# Patient Record
Sex: Female | Born: 1945 | Race: White | Hispanic: No | Marital: Single | Smoking: Former smoker
Health system: Southern US, Community
[De-identification: ages and names within clinical notes are randomized; demographics above are authoritative.]

## PROBLEM LIST (undated history)

## (undated) DIAGNOSIS — K219 Gastro-esophageal reflux disease without esophagitis: Secondary | ICD-10-CM

## (undated) DIAGNOSIS — I10 Essential (primary) hypertension: Secondary | ICD-10-CM

## (undated) DIAGNOSIS — G5600 Carpal tunnel syndrome, unspecified upper limb: Secondary | ICD-10-CM

## (undated) DIAGNOSIS — M549 Dorsalgia, unspecified: Secondary | ICD-10-CM

## (undated) DIAGNOSIS — E7800 Pure hypercholesterolemia, unspecified: Secondary | ICD-10-CM

## (undated) HISTORY — PX: CATARACT EXTRACTION BILATERAL W/ ANTERIOR VITRECTOMY: SHX1304

## (undated) HISTORY — DX: Dorsalgia, unspecified: M54.9

## (undated) HISTORY — DX: Carpal tunnel syndrome, unspecified upper limb: G56.00

---

## 2002-02-14 ENCOUNTER — Ambulatory Visit (HOSPITAL_COMMUNITY): Admission: RE | Admit: 2002-02-14 | Discharge: 2002-02-14 | Payer: Self-pay | Admitting: Family Medicine

## 2002-02-14 ENCOUNTER — Encounter: Payer: Self-pay | Admitting: Family Medicine

## 2003-07-06 ENCOUNTER — Ambulatory Visit (HOSPITAL_COMMUNITY): Admission: RE | Admit: 2003-07-06 | Discharge: 2003-07-06 | Payer: Self-pay | Admitting: Family Medicine

## 2008-05-27 ENCOUNTER — Emergency Department (HOSPITAL_COMMUNITY): Admission: EM | Admit: 2008-05-27 | Discharge: 2008-05-27 | Payer: Self-pay | Admitting: Emergency Medicine

## 2008-06-14 ENCOUNTER — Ambulatory Visit (HOSPITAL_COMMUNITY): Admission: RE | Admit: 2008-06-14 | Discharge: 2008-06-14 | Payer: Self-pay | Admitting: Family Medicine

## 2008-08-20 ENCOUNTER — Encounter: Payer: Self-pay | Admitting: Gastroenterology

## 2008-09-04 ENCOUNTER — Encounter: Payer: Self-pay | Admitting: Gastroenterology

## 2008-09-04 ENCOUNTER — Ambulatory Visit (HOSPITAL_COMMUNITY): Admission: RE | Admit: 2008-09-04 | Discharge: 2008-09-04 | Payer: Self-pay | Admitting: Gastroenterology

## 2008-09-04 ENCOUNTER — Ambulatory Visit: Payer: Self-pay | Admitting: Gastroenterology

## 2009-12-25 ENCOUNTER — Ambulatory Visit (HOSPITAL_COMMUNITY): Admission: RE | Admit: 2009-12-25 | Discharge: 2009-12-25 | Payer: Self-pay | Admitting: Family Medicine

## 2010-06-04 LAB — BASIC METABOLIC PANEL
CO2: 24 mEq/L (ref 19–32)
Chloride: 107 mEq/L (ref 96–112)
GFR calc Af Amer: >60 mL/min (ref >60)
Glucose, Bld: 137 mg/dL — ABNORMAL HIGH (ref 70–99)
Sodium: 137 mEq/L (ref 135–145)

## 2010-06-04 LAB — CBC
HCT: 37.3 % (ref 36.0–46.0)
Hemoglobin: 13.1 g/dL (ref 12.0–15.0)
MCHC: 35.2 g/dL (ref 30.0–36.0)
MCV: 92.5 fL (ref 78.0–100.0)
RBC: 4.03 MIL/uL (ref 3.87–5.11)
RDW: 13 % (ref 11.5–15.5)

## 2010-06-04 LAB — DIFFERENTIAL
Basophils Absolute: 0 10*3/uL (ref 0.0–0.1)
Basophils Relative: 1 % (ref 0–1)
Eosinophils Absolute: 0.3 10*3/uL (ref 0.0–0.7)
Eosinophils Relative: 5 % (ref 0–5)
Monocytes Absolute: 0.6 10*3/uL (ref 0.1–1.0)
Monocytes Relative: 10 % (ref 3–12)

## 2010-06-04 LAB — POCT CARDIAC MARKERS
CKMB, poc: 1.2 ng/mL (ref 1.0–8.0)
CKMB, poc: 1.4 ng/mL (ref 1.0–8.0)
Myoglobin, poc: 63.9 ng/mL (ref 12–200)
Troponin i, poc: <0.05 ng/mL (ref 0.00–0.09)

## 2010-06-20 ENCOUNTER — Emergency Department (HOSPITAL_COMMUNITY)
Admission: EM | Admit: 2010-06-20 | Discharge: 2010-06-21 | Disposition: A | Discharge status: Home / Self Care | Payer: BC Managed Care – PPO | Attending: Emergency Medicine | Admitting: Emergency Medicine

## 2010-06-20 ENCOUNTER — Emergency Department (HOSPITAL_COMMUNITY): Payer: BC Managed Care – PPO

## 2010-06-20 DIAGNOSIS — R071 Chest pain on breathing: Secondary | ICD-10-CM | POA: Insufficient documentation

## 2010-06-21 LAB — BASIC METABOLIC PANEL
BUN: 14 mg/dL (ref 6–23)
CO2: 25 mEq/L (ref 19–32)
Calcium: 8.9 mg/dL (ref 8.4–10.5)
Chloride: 99 mEq/L (ref 96–112)
Creatinine, Ser: 0.69 mg/dL (ref 0.4–1.2)
GFR calc Af Amer: >60 mL/min (ref >60)
GFR calc non Af Amer: >60 mL/min (ref >60)
Glucose, Bld: 97 mg/dL (ref 70–99)
Potassium: 3.8 mEq/L (ref 3.5–5.1)
Sodium: 131 mEq/L — ABNORMAL LOW (ref 135–145)

## 2010-06-21 LAB — DIFFERENTIAL
Basophils Absolute: 0 10*3/uL (ref 0.0–0.1)
Basophils Relative: 0 % (ref 0–1)
Eosinophils Absolute: 0.1 10*3/uL (ref 0.0–0.7)
Eosinophils Relative: 2 % (ref 0–5)
Lymphocytes Relative: 33 % (ref 12–46)
Lymphs Abs: 2.2 10*3/uL (ref 0.7–4.0)
Monocytes Absolute: 0.7 10*3/uL (ref 0.1–1.0)
Monocytes Relative: 11 % (ref 3–12)
Neutro Abs: 3.7 10*3/uL (ref 1.7–7.7)
Neutrophils Relative %: 55 % (ref 43–77)

## 2010-06-21 LAB — CBC
HCT: 37.6 % (ref 36.0–46.0)
Hemoglobin: 12.5 g/dL (ref 12.0–15.0)
MCH: 31 pg (ref 26.0–34.0)
MCHC: 33.2 g/dL (ref 30.0–36.0)
MCV: 93.3 fL (ref 78.0–100.0)
Platelets: 281 10*3/uL (ref 150–400)
RBC: 4.03 MIL/uL (ref 3.87–5.11)
RDW: 12.8 % (ref 11.5–15.5)
WBC: 6.7 10*3/uL (ref 4.0–10.5)

## 2010-06-21 LAB — POCT CARDIAC MARKERS
CKMB, poc: 2.7 ng/mL (ref 1.0–8.0)
Myoglobin, poc: 71 ng/mL (ref 12–200)

## 2010-07-08 NOTE — Op Note (Signed)
Morgan Hill, Morgan Hill                ACCOUNT NO.:  [id number]   MEDICAL RECORD NO.:  [id number]          PATIENT TYPE:  AMB   LOCATION:  DAY                           FACILITY:  [facility]   PHYSICIAN:  Kassie Mends, M.D.      DATE OF BIRTH:  12-05-1945   DATE OF PROCEDURE:  09/04/2008  DATE OF DISCHARGE:  09/04/2008                               OPERATIVE REPORT   REFERRING PHYSICIAN:  Donna Bernard, MD   PROCEDURE:  Colonoscopy with cold forceps polypectomy.   INDICATION FOR EXAM:  Morgan Hill is a 65 year old female who presented 2  weeks ago with bloody diarrhea.  Her abdominal pain and diarrhea and  rectal bleeding have resolved.   FINDINGS:  1. A 4-mm sessile rectal polyp removed via cold forceps.  Otherwise,      no masses, inflammatory changes, or AVMs.  2. Rare sigmoid colon diverticula.  3. Normal terminal ileum, approximately 10 cm visualized.  4. Small internal hemorrhoids, otherwise normal retroflexed view of      the rectum.   RECOMMENDATIONS:  1. Will call Morgan Hill with the results of her biopsies.  If she has      a simple adenoma or hyperplastic polyps, then screening colonoscopy      in 10 years.  2. No aspirin, NSAIDs, or anticoagulation for 7 days.  She should      follow a high-fiber diet.  She was given a handout on high-fiber      diet, polyps, diverticulosis, and hemorrhoids.   MEDICATIONS:  1. Demerol 75 mg IV.  2. Versed 5 mg IV.   PROCEDURE TECHNIQUE:  Physical exam was performed.  Informed consent was  obtained from the patient after explaining the benefits, risks, and  alternatives to the procedure.  The patient was connected to the  monitored and placed in left lateral position.  Continuous oxygen was  provided by nasal cannula and IV medicine administered through an  indwelling cannula.  After administration of sedation and rectal exam,  the patient's rectum was intubated, and the scope was advanced under  direct visualization to the distal  terminal ileum.  Scope was removed  slowly by  carefully examining the color, texture, anatomy, and integrity of the  mucosa on the way out.  The patient was recovered in endoscopy and  discharged home in satisfactory condition.   PATH:  BENIGN POLYPOID TISSUE      Kassie Mends, M.D.  Electronically Signed     SM/MEDQ  D:  09/04/2008  T:  09/05/2008  Job:  [id number]   cc:   Donna Bernard, M.D.  Fax: [phone]

## 2012-04-01 ENCOUNTER — Other Ambulatory Visit: Payer: Self-pay | Admitting: Family Medicine

## 2012-04-01 DIAGNOSIS — Z139 Encounter for screening, unspecified: Secondary | ICD-10-CM

## 2012-04-05 ENCOUNTER — Ambulatory Visit (HOSPITAL_COMMUNITY): Payer: BC Managed Care – PPO

## 2012-04-11 ENCOUNTER — Ambulatory Visit (HOSPITAL_COMMUNITY)
Admission: RE | Admit: 2012-04-11 | Discharge: 2012-04-11 | Disposition: A | Discharge status: Home / Self Care | Payer: BC Managed Care – PPO | Source: Ambulatory Visit | Attending: Family Medicine | Admitting: Family Medicine

## 2012-04-11 DIAGNOSIS — Z139 Encounter for screening, unspecified: Secondary | ICD-10-CM

## 2012-04-11 DIAGNOSIS — Z1231 Encounter for screening mammogram for malignant neoplasm of breast: Secondary | ICD-10-CM | POA: Insufficient documentation

## 2012-04-11 LAB — HM MAMMOGRAPHY: HM MAMMO: NORMAL

## 2013-01-10 ENCOUNTER — Encounter: Payer: Self-pay | Admitting: Family Medicine

## 2013-01-10 ENCOUNTER — Ambulatory Visit (INDEPENDENT_AMBULATORY_CARE_PROVIDER_SITE_OTHER): Payer: BC Managed Care – PPO | Admitting: Family Medicine

## 2013-01-10 VITALS — BP 148/70 | Temp 98.3°F | Ht 63.0 in | Wt 185.0 lb

## 2013-01-10 DIAGNOSIS — J309 Allergic rhinitis, unspecified: Secondary | ICD-10-CM

## 2013-01-10 DIAGNOSIS — J019 Acute sinusitis, unspecified: Secondary | ICD-10-CM

## 2013-01-10 MED ORDER — SULFAMETHOXAZOLE-TMP DS 800-160 MG PO TABS: 1.0000 | ORAL_TABLET | Freq: Two times a day (BID) | ORAL | Status: DC

## 2013-01-10 MED ORDER — FLUTICASONE PROPIONATE 50 MCG/ACT NA SUSP: 2.0000 | Freq: Every day | NASAL | Status: DC

## 2013-01-10 NOTE — Progress Notes (Signed)
  Subjective:    Patient ID: Morgan Hill, female    DOB: 10-Aug-1945, 67 y.o.   MRN: [id number]  Sore Throat  This is a recurrent problem. The current episode started more than 1 month ago.   This fall with allergy issues. Oct 7 had flu shot Later in Oct got worse, had sinus like illness No wheezing no fever no SOB PMH- benign Urgent care Amoxil in Oct  PMH benign [facility]   Review of Systems See above, hoarse as the days goes on    Objective:   Physical Exam  Nursing note and vitals reviewed. Constitutional: She appears well-developed.  HENT:  Head: Normocephalic.  Nose: Nose normal.  Mouth/Throat: Oropharynx is clear and moist. No oropharyngeal exudate.  Neck: Neck supple.  Cardiovascular: Normal rate and normal heart sounds.   No murmur heard. Pulmonary/Chest: Effort normal and breath sounds normal. She has no wheezes.  Lymphadenopathy:    She has no cervical adenopathy.  Skin: Skin is warm and dry.          Assessment & Plan:  Allergic rhinitis-flonase / loratadine for 4 weeks Sinusitis - Bactrim DS bid 10 days Call if ongoing Warning signs were discussed followup if any problems

## 2013-01-10 NOTE — Patient Instructions (Signed)
Allergic rhinitis- use generic Claritin ( called Loratadine ) 10 mg tablet take daily  Flonase 2 sprays each nare daily  Use allergy med for next 4 weeks, you should clear up  Add antibiotic to it

## 2013-05-02 ENCOUNTER — Other Ambulatory Visit: Payer: Self-pay

## 2013-05-02 ENCOUNTER — Encounter (HOSPITAL_COMMUNITY): Payer: Self-pay | Admitting: Emergency Medicine

## 2013-05-02 ENCOUNTER — Observation Stay (HOSPITAL_COMMUNITY)
Admission: EM | Admit: 2013-05-02 | Discharge: 2013-05-03 | Disposition: A | Discharge status: Home / Self Care | Payer: BC Managed Care – PPO | Attending: Internal Medicine | Admitting: Internal Medicine

## 2013-05-02 ENCOUNTER — Emergency Department (HOSPITAL_COMMUNITY): Payer: BC Managed Care – PPO

## 2013-05-02 DIAGNOSIS — R7989 Other specified abnormal findings of blood chemistry: Secondary | ICD-10-CM | POA: Diagnosis present

## 2013-05-02 DIAGNOSIS — Z87891 Personal history of nicotine dependence: Secondary | ICD-10-CM | POA: Insufficient documentation

## 2013-05-02 DIAGNOSIS — Z7982 Long term (current) use of aspirin: Secondary | ICD-10-CM | POA: Insufficient documentation

## 2013-05-02 DIAGNOSIS — E039 Hypothyroidism, unspecified: Secondary | ICD-10-CM | POA: Insufficient documentation

## 2013-05-02 DIAGNOSIS — K219 Gastro-esophageal reflux disease without esophagitis: Secondary | ICD-10-CM | POA: Diagnosis present

## 2013-05-02 DIAGNOSIS — R079 Chest pain, unspecified: Principal | ICD-10-CM | POA: Diagnosis present

## 2013-05-02 DIAGNOSIS — E669 Obesity, unspecified: Secondary | ICD-10-CM | POA: Diagnosis present

## 2013-05-02 HISTORY — DX: Gastro-esophageal reflux disease without esophagitis: K21.9

## 2013-05-02 LAB — CBC WITH DIFFERENTIAL/PLATELET
Basophils Absolute: 0 10*3/uL (ref 0.0–0.1)
Basophils Relative: 1 % (ref 0–1)
Eosinophils Absolute: 0.1 10*3/uL (ref 0.0–0.7)
Eosinophils Relative: 1 % (ref 0–5)
HEMATOCRIT: 42.3 % (ref 36.0–46.0)
HEMOGLOBIN: 14.3 g/dL (ref 12.0–15.0)
LYMPHS ABS: 1.5 10*3/uL (ref 0.7–4.0)
LYMPHS PCT: 23 % (ref 12–46)
MCH: 31.2 pg (ref 26.0–34.0)
MCHC: 33.8 g/dL (ref 30.0–36.0)
MCV: 92.2 fL (ref 78.0–100.0)
MONO ABS: 0.4 10*3/uL (ref 0.1–1.0)
MONOS PCT: 7 % (ref 3–12)
NEUTROS ABS: 4.5 10*3/uL (ref 1.7–7.7)
NEUTROS PCT: 69 % (ref 43–77)
Platelets: 302 10*3/uL (ref 150–400)
RBC: 4.59 MIL/uL (ref 3.87–5.11)
RDW: 12.7 % (ref 11.5–15.5)
WBC: 6.5 10*3/uL (ref 4.0–10.5)

## 2013-05-02 LAB — BASIC METABOLIC PANEL
BUN: 12 mg/dL (ref 6–23)
CHLORIDE: 100 meq/L (ref 96–112)
CO2: 25 meq/L (ref 19–32)
CREATININE: 0.72 mg/dL (ref 0.50–1.10)
Calcium: 10 mg/dL (ref 8.4–10.5)
GFR calc non Af Amer: 88 mL/min — ABNORMAL LOW (ref >90)
GLUCOSE: 117 mg/dL — AB (ref 70–99)
POTASSIUM: 3.9 meq/L (ref 3.7–5.3)
Sodium: 140 mEq/L (ref 137–147)

## 2013-05-02 LAB — TROPONIN I
Troponin I: <0.3 ng/mL (ref <0.30)
Troponin I: <0.3 ng/mL (ref <0.30)

## 2013-05-02 MED ORDER — HEPARIN SODIUM (PORCINE) 5000 UNIT/ML IJ SOLN
5000.0000 [IU] | Freq: Three times a day (TID) | INTRAMUSCULAR | Status: DC
Start: 2013-05-02 — End: 2013-05-03
  Administered 2013-05-02 – 2013-05-03 (×4): 5000 [IU] via SUBCUTANEOUS
  Filled 2013-05-02 (×4): qty 1

## 2013-05-02 MED ORDER — NITROGLYCERIN 0.4 MG SL SUBL: 0.4000 mg | SUBLINGUAL_TABLET | SUBLINGUAL | Status: DC | PRN

## 2013-05-02 MED ORDER — GI COCKTAIL ~~LOC~~
30.0000 mL | Freq: Once | ORAL | Status: AC
  Administered 2013-05-02: 30 mL via ORAL
  Filled 2013-05-02: qty 30

## 2013-05-02 MED ORDER — ONDANSETRON HCL 4 MG/2ML IJ SOLN: 4.0000 mg | Freq: Four times a day (QID) | INTRAMUSCULAR | Status: DC | PRN

## 2013-05-02 MED ORDER — ASPIRIN 81 MG PO CHEW
324.0000 mg | CHEWABLE_TABLET | Freq: Once | ORAL | Status: AC
  Administered 2013-05-02: 324 mg via ORAL
  Filled 2013-05-02: qty 4

## 2013-05-02 MED ORDER — NITROGLYCERIN 0.4 MG SL SUBL
0.4000 mg | SUBLINGUAL_TABLET | Freq: Once | SUBLINGUAL | Status: DC
  Filled 2013-05-02: qty 1

## 2013-05-02 MED ORDER — ONDANSETRON HCL 4 MG PO TABS: 4.0000 mg | ORAL_TABLET | Freq: Four times a day (QID) | ORAL | Status: DC | PRN

## 2013-05-02 NOTE — ED Provider Notes (Signed)
CSN: [id number]     Arrival date & time 05/02/13  1336 History  This chart was scribed for Morgan Creasehristopher J. Pollina, MD by Quintella ReichertMatthew Underwood, ED scribe.  This patient was seen in room [facility] and the patient's care was started at 2:12 PM.   Chief Complaint  Patient presents with  . Chest Pain    The history is provided by the patient. No language interpreter was used.    HPI Comments: Morgan Hill is a 68 y.o. female who presents to the Emergency Department complaining of sudden-onset chest pain that began this morning.  Pt states she initially felt dizzy and lightheaded and then became nauseated.  She then developed a burning and pressure sensation in her mid-center chest.  She checked her BP and it was 170/92.  She drank some vinegar ("home remedy" for high BP) and began to feel slightly better and when she checked her BP again it had improved slightly.  Her CP has improved slightly but she can still feel "a little bit heat" in that area.  Pt states she has had similar symptoms previously which were diagnosed as GERD.  She does note that she had heartburn yesterday.  She denies prior h/o HTN and does not take any medications regularly.   History reviewed. No pertinent past medical history.  History reviewed. No pertinent past surgical history.  No family history on file.   History  Substance Use Topics  . Smoking status: Former Games developermoker  . Smokeless tobacco: Former NeurosurgeonUser    Quit date: 01/11/1996  . Alcohol Use: Not on file    OB History   Grav Para Term Preterm Abortions TAB SAB Ect Mult Living                   Review of Systems  Cardiovascular: Positive for chest pain.  Gastrointestinal: Positive for nausea.       Heartburn yesterday  Neurological: Positive for dizziness and light-headedness.  All other systems reviewed and are negative.      Allergies  Review of patient's allergies indicates no known allergies.  Home Medications   No current outpatient  prescriptions on file. BP 148/67  Pulse 82  Temp(Src) 98.4 F (36.9 C) (Oral)  Resp 18  Ht 5\' 3"  (1.6 m)  Wt 182 lb (82.555 kg)  BMI 32.25 kg/m2  SpO2 99%  Physical Exam  Nursing note and vitals reviewed. Constitutional: She is oriented to person, place, and time. She appears well-developed and well-nourished. No distress.  HENT:  Head: Normocephalic and atraumatic.  Right Ear: Hearing normal.  Left Ear: Hearing normal.  Nose: Nose normal.  Mouth/Throat: Oropharynx is clear and moist and mucous membranes are normal.  Eyes: Conjunctivae and EOM are normal. Pupils are equal, round, and reactive to light.  Neck: Normal range of motion. Neck supple.  Cardiovascular: Regular rhythm, S1 normal and S2 normal.  Exam reveals no gallop and no friction rub.   No murmur heard. Pulmonary/Chest: Effort normal and breath sounds normal. No respiratory distress. She exhibits no tenderness.  Abdominal: Soft. Normal appearance and bowel sounds are normal. There is no hepatosplenomegaly. There is no tenderness. There is no rebound, no guarding, no tenderness at McBurney's point and negative Murphy's sign. No hernia.  Musculoskeletal: Normal range of motion.  Neurological: She is alert and oriented to person, place, and time. She has normal strength. No cranial nerve deficit or sensory deficit. Coordination normal. GCS eye subscore is 4. GCS verbal subscore is 5. GCS  motor subscore is 6.  Skin: Skin is warm, dry and intact. No rash noted. No cyanosis.  Psychiatric: She has a normal mood and affect. Her speech is normal and behavior is normal. Thought content normal.    ED Course  Procedures (including critical care time)  DIAGNOSTIC STUDIES: Oxygen Saturation is 99% on room air, normal by my interpretation.    COORDINATION OF CARE: 2:16 PM-Discussed treatment plan which includes review of pt's records, EKG, CXR, and labs with pt at bedside and pt agreed to plan.     Labs Review Labs Reviewed   BASIC METABOLIC PANEL - Abnormal; Notable for the following:    Glucose, Bld 117 (*)    GFR calc non Af Amer 88 (*)    All other components within normal limits  TROPONIN I  CBC WITH DIFFERENTIAL    Imaging Review Dg Chest Portable 1 View  05/02/2013   CLINICAL DATA Chest pain and hypertension  EXAM PORTABLE CHEST - 1 VIEW  COMPARISON DG CHEST 2 VIEW dated 06/20/2010; DG CHEST 2 VIEW dated 06/14/2008; DG CHEST 2 VIEW dated 05/27/2008  FINDINGS The heart size and mediastinal contours are within normal limits. Both lungs are clear. The visualized skeletal structures are unremarkable.  IMPRESSION No active disease.  SIGNATURE  Electronically Signed   By: Esperanza Heir M.D.   On: 05/02/2013 14:29     EKG Interpretation None      Date: 05/02/2013  Rate: 79  Rhythm: normal sinus rhythm  QRS Axis: normal  Intervals: normal  ST/T Wave abnormalities: normal  Conduction Disutrbances:none  Narrative Interpretation:   Old EKG Reviewed: unchanged    MDM   Final diagnoses:  Chest pain    Patient presents to the ER for evaluation of chest pain. Patient reports that she started having dizziness, felt weak, nauseated and then noticed a burning sensation in her chest. She was at work when this occurred. She thinks that the pain got slightly worse when she went back to work and exerted herself. Her blood pressure was checked at work and it was somewhat elevated, 170/92 she does not have a previous history of hypertension. Blood pressure was somewhat improved here in the ER. After a single nitroglycerin, now normotensive. Patient's pain is essentially gone, although she now tells me she is having intermittent episodes of a burning sensation that come and go quickly. Patient's EKG does not show any ischemic changes. Troponin is negative.   further discussion with the patient reveals that both of her parents have had heart attacks. She has a significant family history including parental parents and  siblings have had coronary artery disease. She has never had a cardiac workup and therefore I have recommended hospitalization for further evaluation.    I personally performed the services described in this documentation, which was scribed in my presence. The recorded information has been reviewed and is accurate.'    Morgan Crease, MD 05/02/13 [phone]

## 2013-05-02 NOTE — ED Notes (Signed)
MD at bedside. 

## 2013-05-02 NOTE — H&P (Signed)
[facility] History and Physical  HAMDA KLUTTS ZOX:[id number] DOB: 1945-12-22 DOA: 05/02/2013  Referring physician: ER. PCP: Lilyan Punt, MD   Chief Complaint: Chest pain.  HPI: Morgan Hill is a 68 y.o. female  This is a very pleasant 68 year old [place] lady who presents with chest pain. The chest pain is in the lower chest, started at 7:30 AM this morning while she was at work. The initial chest pain lasted approximately 5 minutes or so, improved and then further chest pain came back. She said that overall she has had chest pain 3-4 times today. The characteristic of the chest pain are that it is a burning pain with some heaviness. It is not associated with sweating or nausea. He does not seem to radiate into her arms or upper neck. There is no history of coronary artery disease in the past. She did have reflux yesterday which she is familiar with, this is different to the symptoms she is had today. She denies any dyspnea or palpitations. At work, she was found to be hypertensive and also in the emergency room, now she is normotensive. Both her parents have had coronary disease but this was at later stage in her life. She does not have a history of hypertension, diabetes her cholesterol is modestly elevated according to her. She is a nonsmoker.   Review of Systems:  Constitutional:  No weight loss, night sweats, Fevers, chills, fatigue.  HEENT:  No headaches, Difficulty swallowing,Tooth/dental problems,Sore throat,  No sneezing, itching, ear ache, nasal congestion, post nasal drip,  Cardio-vascular:  No Orthopnea, PND, swelling in lower extremities, anasarca, dizziness, palpitations  GI:  No abdominal pain, nausea, vomiting, diarrhea, change in bowel habits, loss of appetite  Resp:  No shortness of breath with exertion or at rest. No excess mucus, no productive cough, No non-productive cough, No coughing up of blood.No change in color of mucus.No wheezing.No chest wall  deformity  Skin:  no rash or lesions.  GU:  no dysuria, change in color of urine, no urgency or frequency. No flank pain.  Musculoskeletal:  No joint pain or swelling. No decreased range of motion. No back pain.  Psych:  No change in mood or affect. No depression or anxiety. No memory loss.     Social History:  reports that she has quit smoking. She quit smokeless tobacco use about 17 years ago. Her alcohol and drug histories are not on file. she has lived in this country for [place] years. 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  No Known Allergies  No family history on file.   Prior to Admission medications   Not on File   Physical Exam: Filed Vitals:   05/02/13 1530  BP:   Pulse: 81  Temp:   Resp: 14    BP 117/59  Pulse 81  Temp(Src) 98.4 F (36.9 C) (Oral)  Resp 14  Ht 5\' 3"  (1.6 m)  Wt 82.555 kg (182 lb)  BMI 32.25 kg/m2  SpO2 99%  General:  Appears calm and comfortable Eyes: PERRL, normal lids, irises & conjunctiva ENT: grossly normal hearing, lips & tongue Neck: no LAD, masses or thyromegaly Cardiovascular: RRR, no m/r/g. No LE edema. Telemetry: SR, no arrhythmias  Respiratory: CTA bilaterally, no w/r/r. Normal respiratory effort. Abdomen: soft, ntnd Skin: no rash or induration seen on limited exam Musculoskeletal: grossly normal tone BUE/BLE. No chest wall tenderness. Psychiatric: grossly normal mood and affect, speech fluent and appropriate Neurologic: grossly non-focal.  Labs on Admission:  Basic Metabolic Panel:  Recent Labs Lab 05/02/13 1421  NA 140  K 3.9  CL 100  CO2 25  GLUCOSE 117*  BUN 12  CREATININE 0.72  CALCIUM 10.0       CBC:  Recent Labs Lab 05/02/13 1421  WBC 6.5  NEUTROABS 4.5  HGB 14.3  HCT 42.3  MCV 92.2  PLT 302   Cardiac Enzymes:  Recent Labs Lab 05/02/13 1421  TROPONINI <0.30    BNP (last 3 results) No results found for this basename: PROBNP,  in the last 8760 hours CBG: No results  found for this basename: GLUCAP,  in the last 168 hours  Radiological Exams on Admission: Dg Chest Portable 1 View  05/02/2013   CLINICAL DATA Chest pain and hypertension  EXAM PORTABLE CHEST - 1 VIEW  COMPARISON DG CHEST 2 VIEW dated 06/20/2010; DG CHEST 2 VIEW dated 06/14/2008; DG CHEST 2 VIEW dated 05/27/2008  FINDINGS The heart size and mediastinal contours are within normal limits. Both lungs are clear. The visualized skeletal structures are unremarkable.  IMPRESSION No active disease.  SIGNATURE  Electronically Signed   By: Esperanza Heiraymond  Rubner M.D.   On: 05/02/2013 14:29    EKG: Independently reviewed. Normal sinus rhythm without any acute ST-T wave changes.  Assessment/Plan   1. Chest pain. The characteristics of the pain are somewhat worrisome for angina/cardiac pain. 2. Obesity.  Plan: 1. Admit to telemetry floor. 2. Serial cardiac enzymes and ECG in the morning. 3. Cardiology consultation. I think she requires a stress test at a minimum.  Other recommendations will depend on patient's hospital progress.   Code Status: Full code.   Family Communication: Discussed plan with patient at the bedside.   Disposition Plan: Home when medically stable.  Time spent: 30 minutes.  Wilson SingerGOSRANI,Jezebel Pollet C [facility]

## 2013-05-02 NOTE — ED Notes (Signed)
Pt states that she was at work this am when she had a sudden onset of being light headed, burning sensation and pressure to mid center area. Pain has been non radiating, states that personal checked her blood pressure at work and it was elevated 179/92. Denies any episodes of n/v, diaphorsis, reports that she did have heartburn yesterday.

## 2013-05-03 ENCOUNTER — Other Ambulatory Visit: Payer: Self-pay | Admitting: *Deleted

## 2013-05-03 DIAGNOSIS — R7989 Other specified abnormal findings of blood chemistry: Secondary | ICD-10-CM | POA: Diagnosis present

## 2013-05-03 DIAGNOSIS — R946 Abnormal results of thyroid function studies: Secondary | ICD-10-CM

## 2013-05-03 DIAGNOSIS — R079 Chest pain, unspecified: Secondary | ICD-10-CM

## 2013-05-03 DIAGNOSIS — K219 Gastro-esophageal reflux disease without esophagitis: Secondary | ICD-10-CM

## 2013-05-03 LAB — LIPID PANEL
CHOLESTEROL: 189 mg/dL (ref 0–200)
HDL: 78 mg/dL (ref >39)
LDL Cholesterol: 96 mg/dL (ref 0–99)
Total CHOL/HDL Ratio: 2.4 RATIO
Triglycerides: 76 mg/dL (ref <150)
VLDL: 15 mg/dL (ref 0–40)

## 2013-05-03 LAB — TSH: TSH: 6.491 u[IU]/mL — ABNORMAL HIGH (ref 0.350–4.500)

## 2013-05-03 LAB — COMPREHENSIVE METABOLIC PANEL
ALK PHOS: 57 U/L (ref 39–117)
ALT: 16 U/L (ref 0–35)
AST: 17 U/L (ref 0–37)
Albumin: 3.7 g/dL (ref 3.5–5.2)
BILIRUBIN TOTAL: 0.7 mg/dL (ref 0.3–1.2)
BUN: 14 mg/dL (ref 6–23)
CHLORIDE: 105 meq/L (ref 96–112)
CO2: 26 meq/L (ref 19–32)
Calcium: 9 mg/dL (ref 8.4–10.5)
Creatinine, Ser: 0.78 mg/dL (ref 0.50–1.10)
GFR calc Af Amer: >90 mL/min (ref >90)
GFR, EST NON AFRICAN AMERICAN: 85 mL/min — AB (ref >90)
Glucose, Bld: 102 mg/dL — ABNORMAL HIGH (ref 70–99)
Potassium: 4.1 mEq/L (ref 3.7–5.3)
SODIUM: 144 meq/L (ref 137–147)
Total Protein: 7.2 g/dL (ref 6.0–8.3)

## 2013-05-03 LAB — CBC
HCT: 39.3 % (ref 36.0–46.0)
Hemoglobin: 13.1 g/dL (ref 12.0–15.0)
MCH: 31.2 pg (ref 26.0–34.0)
MCHC: 33.3 g/dL (ref 30.0–36.0)
MCV: 93.6 fL (ref 78.0–100.0)
Platelets: 271 10*3/uL (ref 150–400)
RBC: 4.2 MIL/uL (ref 3.87–5.11)
RDW: 12.9 % (ref 11.5–15.5)
WBC: 5.2 10*3/uL (ref 4.0–10.5)

## 2013-05-03 LAB — TROPONIN I

## 2013-05-03 MED ORDER — ASPIRIN 81 MG PO TBEC: 81.0000 mg | DELAYED_RELEASE_TABLET | Freq: Every day | ORAL | Status: DC

## 2013-05-03 MED ORDER — ASPIRIN EC 81 MG PO TBEC
81.0000 mg | DELAYED_RELEASE_TABLET | Freq: Every day | ORAL | Status: DC
  Administered 2013-05-03: 81 mg via ORAL
  Filled 2013-05-03: qty 1

## 2013-05-03 MED ORDER — NITROGLYCERIN 0.4 MG SL SUBL: 0.4000 mg | SUBLINGUAL_TABLET | SUBLINGUAL | Status: DC | PRN

## 2013-05-03 NOTE — Consult Note (Signed)
Reason for Consult:chest pain Referring Physician: PTH  AALAIYAH YASSIN is an 68 y.o. female.  HPI: This is a 68 year old female patient with no prior cardiac history who was admitted with chest pain yesterday. Cardiac enzymes are negative an EKG shows T wave inversion in aVR and V1, unchanged from EKG in 2012.  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 Her BP was elevated & they gave her vinegar which made her reflux worse. The pain eased after 5-10 minutes. Monday she had trouble with GE reflux off/on all day. More of a burning and reflux into her esophagus. It was different from yesterdays chest pain. 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 She's also been under a lot of stress.  She has a 30 pk yr history of smoking but quit in 1997. Both parents had MI's in their 19's. She has never had HTN, DM, or hyperlipidemia. Denies exertional chest pressure. She continues to feel a little something in her chest today but can't describe it.  Past Medical History  Diagnosis Date  . GERD (gastroesophageal reflux disease)     History reviewed. No pertinent past surgical history.  History reviewed. No pertinent family history.  Social History:  reports that she has quit smoking. She quit smokeless tobacco use about 17 years ago. Her alcohol and drug histories are not on file.  Allergies: No Known Allergies  Medications:  Scheduled Meds: . heparin  5,000 Units Subcutaneous 3 times per day  . nitroGLYCERIN  0.4 mg Sublingual Once   Continuous Infusions:  PRN Meds:.nitroGLYCERIN, ondansetron (ZOFRAN) IV, ondansetron   Results for orders placed during the hospital encounter of 05/02/13 (from the past 48 hour(s))  TROPONIN I     Status: None   Collection Time    05/02/13  2:21 PM      Result Value Ref  Range   Troponin I <0.30  <0.30 ng/mL   Comment:            Due to the release kinetics of cTnI,     a negative result within the first hours     of the onset of symptoms does not rule out     myocardial infarction with certainty.     If myocardial infarction is still suspected,     repeat the test at appropriate intervals.  CBC WITH DIFFERENTIAL     Status: None   Collection Time    05/02/13  2:21 PM      Result Value Ref Range   WBC 6.5  4.0 - 10.5 K/uL   RBC 4.59  3.87 - 5.11 MIL/uL   Hemoglobin 14.3  12.0 - 15.0 g/dL   HCT 42.3  36.0 - 46.0 %   MCV 92.2  78.0 - 100.0 fL   MCH 31.2  26.0 - 34.0 pg   MCHC 33.8  30.0 - 36.0 g/dL   RDW 12.7  11.5 - 15.5 %   Platelets 302  150 - 400 K/uL   Neutrophils Relative % 69  43 - 77 %   Neutro Abs 4.5  1.7 - 7.7 K/uL   Lymphocytes Relative 23  12 - 46 %   Lymphs Abs 1.5  0.7 - 4.0 K/uL   Monocytes Relative 7  3 - 12 %  Monocytes Absolute 0.4  0.1 - 1.0 K/uL   Eosinophils Relative 1  0 - 5 %   Eosinophils Absolute 0.1  0.0 - 0.7 K/uL   Basophils Relative 1  0 - 1 %   Basophils Absolute 0.0  0.0 - 0.1 K/uL  BASIC METABOLIC PANEL     Status: Abnormal   Collection Time    05/02/13  2:21 PM      Result Value Ref Range   Sodium 140  137 - 147 mEq/L   Potassium 3.9  3.7 - 5.3 mEq/L   Chloride 100  96 - 112 mEq/L   CO2 25  19 - 32 mEq/L   Glucose, Bld 117 (*) 70 - 99 mg/dL   BUN 12  6 - 23 mg/dL   Creatinine, Ser 0.72  0.50 - 1.10 mg/dL   Calcium 10.0  8.4 - 10.5 mg/dL   GFR calc non Af Amer 88 (*) >90 mL/min   GFR calc Af Amer >90  >90 mL/min   Comment: (NOTE)     The eGFR has been calculated using the CKD EPI equation.     This calculation has not been validated in all clinical situations.     eGFR's persistently <90 mL/min signify possible Chronic Kidney     Disease.  TSH     Status: Abnormal   Collection Time    05/02/13  4:04 PM      Result Value Ref Range   TSH 6.491 (*) 0.350 - 4.500 uIU/mL   Comment: Performed at  [facility]  TROPONIN I     Status: None   Collection Time    05/02/13  4:04 PM      Result Value Ref Range   Troponin I <0.30  <0.30 ng/mL   Comment:            Due to the release kinetics of cTnI,     a negative result within the first hours     of the onset of symptoms does not rule out     myocardial infarction with certainty.     If myocardial infarction is still suspected,     repeat the test at appropriate intervals.  TROPONIN I     Status: None   Collection Time    05/02/13 10:00 PM      Result Value Ref Range   Troponin I <0.30  <0.30 ng/mL   Comment:            Due to the release kinetics of cTnI,     a negative result within the first hours     of the onset of symptoms does not rule out     myocardial infarction with certainty.     If myocardial infarction is still suspected,     repeat the test at appropriate intervals.  TROPONIN I     Status: None   Collection Time    05/03/13  3:58 AM      Result Value Ref Range   Troponin I <0.30  <0.30 ng/mL   Comment:            Due to the release kinetics of cTnI,     a negative result within the first hours     of the onset of symptoms does not rule out     myocardial infarction with certainty.     If myocardial infarction is still suspected,     repeat the test at appropriate intervals.  COMPREHENSIVE METABOLIC  PANEL     Status: Abnormal   Collection Time    05/03/13  3:58 AM      Result Value Ref Range   Sodium 144  137 - 147 mEq/L   Potassium 4.1  3.7 - 5.3 mEq/L   Chloride 105  96 - 112 mEq/L   CO2 26  19 - 32 mEq/L   Glucose, Bld 102 (*) 70 - 99 mg/dL   BUN 14  6 - 23 mg/dL   Creatinine, Ser 0.78  0.50 - 1.10 mg/dL   Calcium 9.0  8.4 - 10.5 mg/dL   Total Protein 7.2  6.0 - 8.3 g/dL   Albumin 3.7  3.5 - 5.2 g/dL   AST 17  0 - 37 U/L   ALT 16  0 - 35 U/L   Alkaline Phosphatase 57  39 - 117 U/L   Total Bilirubin 0.7  0.3 - 1.2 mg/dL   GFR calc non Af Amer 85 (*) >90 mL/min   GFR calc Af Amer >90   >90 mL/min   Comment: (NOTE)     The eGFR has been calculated using the CKD EPI equation.     This calculation has not been validated in all clinical situations.     eGFR's persistently <90 mL/min signify possible Chronic Kidney     Disease.  CBC     Status: None   Collection Time    05/03/13  3:58 AM      Result Value Ref Range   WBC 5.2  4.0 - 10.5 K/uL   RBC 4.20  3.87 - 5.11 MIL/uL   Hemoglobin 13.1  12.0 - 15.0 g/dL   HCT 39.3  36.0 - 46.0 %   MCV 93.6  78.0 - 100.0 fL   MCH 31.2  26.0 - 34.0 pg   MCHC 33.3  30.0 - 36.0 g/dL   RDW 12.9  11.5 - 15.5 %   Platelets 271  150 - 400 K/uL  LIPID PANEL     Status: None   Collection Time    05/03/13  3:58 AM      Result Value Ref Range   Cholesterol 189  0 - 200 mg/dL   Triglycerides 76  <150 mg/dL   HDL 78  >39 mg/dL   Total CHOL/HDL Ratio 2.4     VLDL 15  0 - 40 mg/dL   LDL Cholesterol 96  0 - 99 mg/dL   Comment:            Total Cholesterol/HDL:CHD Risk     Coronary Heart Disease Risk Table                         Men   Women      1/2 Average Risk   3.4   3.3      Average Risk       5.0   4.4      2 X Average Risk   9.6   7.1      3 X Average Risk  23.4   11.0                Use the calculated Patient Ratio     above and the CHD Risk Table     to determine the patient's CHD Risk.                ATP III CLASSIFICATION (LDL):      <100  mg/dL   Optimal      100-129  mg/dL   Near or Above                        Optimal      130-159  mg/dL   Borderline      160-189  mg/dL   High      >190     mg/dL   Very High    Dg Chest Portable 1 View  05/02/2013   CLINICAL DATA Chest pain and hypertension  EXAM PORTABLE CHEST - 1 VIEW  COMPARISON DG CHEST 2 VIEW dated 06/20/2010; DG CHEST 2 VIEW dated 06/14/2008; DG CHEST 2 VIEW dated 05/27/2008  FINDINGS The heart size and mediastinal contours are within normal limits. Both lungs are clear. The visualized skeletal structures are unremarkable.  IMPRESSION No active disease.   SIGNATURE  Electronically Signed   By: Skipper Cliche M.D.   On: 05/02/2013 14:29    ROS See HPI Eyes: Negative Ears:Negative for hearing loss, tinnitus Cardiovascular: Negative for  palpitations,irregular heartbeat, dyspnea, dyspnea on exertion, near-syncope, orthopnea, paroxysmal nocturnal dyspnea and syncope,edema, claudication, cyanosis,.  Respiratory:   Negative for cough, hemoptysis, shortness of breath, sleep disturbances due to breathing, sputum production and wheezing.   Endocrine: Negative for cold intolerance and heat intolerance.  Hematologic/Lymphatic: Negative for adenopathy and bleeding problem. Does not bruise/bleed easily.  Musculoskeletal: Negative.   Gastrointestinal: Negative for nausea, vomiting,  abdominal pain, diarrhea, constipation.   Genitourinary: Negative for bladder incontinence, dysuria, flank pain, frequency, hematuria, hesitancy, nocturia and urgency.  Neurological: Negative.  Allergic/Immunologic: Negative for environmental allergies.  Blood pressure 107/60, pulse 62, temperature 97.6 F (36.4 C), temperature source Oral, resp. rate 20, height 5' 5" (1.651 m), weight 182 lb 1.6 oz (82.6 kg), SpO2 99.00%. Physical Exam PHYSICAL EXAM: Well-nournished, in no acute distress. Neck: No JVD, HJR, Bruit, or thyroid enlargement Lungs: No tachypnea, clear without wheezing, rales, or rhonchi Cardiovascular: RRR, PMI not displaced, heart sounds normal, no murmurs, gallops, bruit, thrill, or heave. Abdomen: BS normal. Soft without organomegaly, masses, lesions or tenderness. Extremities: without cyanosis, clubbing or edema. Good distal pulses bilateral SKin: Warm, no lesions or rashes  Musculoskeletal: No deformities Neuro: no focal signs    Assessment/Plan:  Chest pain: MI ruled out with negative cardiac enzymes. Probably musculoskeletal chest pain, but with multiple cardiac risk factors, should have stress myoview. ? Discharge and do as outpatient?  GERD has  been bothering her recently with increased TUMS use.  Family hx CAD  Former Smoker  New hypothyroid: TSH 6.91  Ermalinda Barrios 05/03/2013, 8:08 AM   Patient seen and discussed with PA Lenze. 68 yo female with no known cardiac history presents with chest pain. Episode started yesterday around 730 AM, 3/10 mid chest/epigastric discomfort with mild palpitations and dizziness that occurred at rest. No SOB. Lasted a few minutes and resolved, has had intermittent discomfort left chest last just a few seconds since then. Risk factors are tobacco and age. She has a family history in older individuals. Fairly mixed symptoms, more atypical. EKG and cardiac enzymes without evidence of ACS, she is more than 24 hrs out from her symptoms. Recommend discharge with outpatient stress echo with cardiology follow up in 1-2 weeks. No further inpatient cardiac intervention or testing at this time, will sign off. ASA and SL NG at discharge.   EKG NSR with no specific EKG changes, troponins have been negative, CXR no acute process. Elevated  TSH 6.49. LDL 96 HDL 78 TG 76 TC 189.     Carlyle Dolly MD

## 2013-05-03 NOTE — Discharge Instructions (Signed)
Chest Pain (Nonspecific) °It is often hard to give a specific diagnosis for the cause of chest pain. There is always a chance that your pain could be related to something serious, such as a heart attack or a blood clot in the lungs. You need to follow up with your caregiver for further evaluation. °CAUSES  °· Heartburn. °· Pneumonia or bronchitis. °· Anxiety or stress. °· Inflammation around your heart (pericarditis) or lung (pleuritis or pleurisy). °· A blood clot in the lung. °· A collapsed lung (pneumothorax). It can develop suddenly on its own (spontaneous pneumothorax) or from injury (trauma) to the chest. °· Shingles infection (herpes zoster virus). °The chest wall is composed of bones, muscles, and cartilage. Any of these can be the source of the pain. °· The bones can be bruised by injury. °· The muscles or cartilage can be strained by coughing or overwork. °· The cartilage can be affected by inflammation and become sore (costochondritis). °DIAGNOSIS  °Lab tests or other studies, such as X-rays, electrocardiography, stress testing, or cardiac imaging, may be needed to find the cause of your pain.  °TREATMENT  °· Treatment depends on what may be causing your chest pain. Treatment may include: °· Acid blockers for heartburn. °· Anti-inflammatory medicine. °· Pain medicine for inflammatory conditions. °· Antibiotics if an infection is present. °· You may be advised to change lifestyle habits. This includes stopping smoking and avoiding alcohol, caffeine, and chocolate. °· You may be advised to keep your head raised (elevated) when sleeping. This reduces the chance of acid going backward from your stomach into your esophagus. °· Most of the time, nonspecific chest pain will improve within 2 to 3 days with rest and mild pain medicine. °HOME CARE INSTRUCTIONS  °· If antibiotics were prescribed, take your antibiotics as directed. Finish them even if you start to feel better. °· For the next few days, avoid physical  activities that bring on chest pain. Continue physical activities as directed. °· Do not smoke. °· Avoid drinking alcohol. °· Only take over-the-counter or prescription medicine for pain, discomfort, or fever as directed by your caregiver. °· Follow your caregiver's suggestions for further testing if your chest pain does not go away. °· Keep any follow-up appointments you made. If you do not go to an appointment, you could develop lasting (chronic) problems with pain. If there is any problem keeping an appointment, you must call to reschedule. °SEEK MEDICAL CARE IF:  °· You think you are having problems from the medicine you are taking. Read your medicine instructions carefully. °· Your chest pain does not go away, even after treatment. °· You develop a rash with blisters on your chest. °SEEK IMMEDIATE MEDICAL CARE IF:  °· You have increased chest pain or pain that spreads to your arm, neck, jaw, back, or abdomen. °· You develop shortness of breath, an increasing cough, or you are coughing up blood. °· You have severe back or abdominal pain, feel nauseous, or vomit. °· You develop severe weakness, fainting, or chills. °· You have a fever. °THIS IS AN EMERGENCY. Do not wait to see if the pain will go away. Get medical help at once. Call your local emergency services (911 in [place] Do not drive yourself to the hospital. °MAKE SURE YOU:  °· Understand these instructions. °· Will watch your condition. °· Will get help right away if you are not doing well or get worse. °Document Released: 11/19/2004 Document Revised: 05/04/2011 Document Reviewed: 09/15/2007 °ExitCare® Patient Information ©2014 [employer]   [employer]. ° °

## 2013-05-03 NOTE — Discharge Summary (Signed)
Physician Discharge Summary  Morgan Hill ZOX:[id number] DOB: 05/08/1945 DOA: 05/02/2013  PCP: Lilyan PuntLUKING,SCOTT, MD  Admit date: 05/02/2013 Discharge date: 05/03/2013  Time spent: 40 minutes  Recommendations for Outpatient Follow-up:  1. Follow up with cardiology in 1-2 weeks for outpatient stress echo 2. Repeat thyroid studies when followed up by primary care doctor  Discharge Diagnoses:  Active Problems:   Chest pain   Obesity   GERD (gastroesophageal reflux disease)   Elevated TSH   Discharge Condition: improved  Diet recommendation: low salt  Filed Weights   05/02/13 1347 05/02/13 1642 05/03/13 0417  Weight: 82.555 kg (182 lb) 82.555 kg (182 lb) 82.6 kg (182 lb 1.6 oz)    History of present illness:  This is a very pleasant 68 year old [place] lady who presents with chest pain. The chest pain is in the lower chest, started at 7:30 AM this morning while she was at work. The initial chest pain lasted approximately 5 minutes or so, improved and then further chest pain came back. She said that overall she has had chest pain 3-4 times today. The characteristic of the chest pain are that it is a burning pain with some heaviness. It is not associated with sweating or nausea. He does not seem to radiate into her arms or upper neck. There is no history of coronary artery disease in the past. She did have reflux yesterday which she is familiar with, this is different to the symptoms she is had today. She denies any dyspnea or palpitations. At work, she was found to be hypertensive and also in the emergency room, now she is normotensive.  Both her parents have had coronary disease but this was at later stage in her life. She does not have a history of hypertension, diabetes her cholesterol is modestly elevated according to her. She is a nonsmoker.   Hospital Course:  This patient was admitted to the hospital with chest pain. She was monitored on telemetry and cardiac enzymes are negative x3.  EKG was unremarkable. She was seen by cardiology it was felt that outpatient stress echocardiogram would be reasonable. The patient has not had any further symptoms here in-hospital. She is ambulating the hallway without recurrence of chest pain. It is possible that her pain may be related to acid reflux. The patient is cleared for discharge home. Incidentally, her TSH was noted to be mildly elevated at 6.4. She will likely need repeat thyroid studies including T3-T4 as an outpatient next 1-2 weeks when she follows up with her primary care doctor.  Procedures:  none  Consultations:  cardiology  Discharge Exam: Filed Vitals:   05/03/13 1255  BP: 117/55  Pulse: 78  Temp: 98 F (36.7 C)  Resp: 20    General: NAD Cardiovascular: S1, S2 RRR Respiratory: cta b  Discharge Instructions  Discharge Orders   Future Appointments Provider Department Dept Phone   05/24/2013 1:30 PM Jodelle GrossKathryn M Lawrence, NP [facility] [phone]   Future Orders Complete By Expires   Call MD for:  difficulty breathing, headache or visual disturbances  As directed    Call MD for:  severe uncontrolled pain  As directed    Diet - low sodium heart healthy  As directed    Increase activity slowly  As directed        Medication List         aspirin 81 MG EC tablet  Take 1 tablet (81 mg total) by mouth daily.     nitroGLYCERIN 0.4  MG SL tablet  Commonly known as:  NITROSTAT  Place 1 tablet (0.4 mg total) under the tongue every 5 (five) minutes as needed for chest pain.       No Known Allergies     Follow-up Information   Follow up with LUKING,SCOTT, MD. Schedule an appointment as soon as possible for a visit in 2 weeks.   Specialty:  Family Medicine   Contact information:   [address] [place] [address] [phone]        The results of significant diagnostics from this hospitalization (including imaging, microbiology, ancillary and laboratory) are listed below  for reference.    Significant Diagnostic Studies: Dg Chest Portable 1 View  05/02/2013   CLINICAL DATA Chest pain and hypertension  EXAM PORTABLE CHEST - 1 VIEW  COMPARISON DG CHEST 2 VIEW dated 06/20/2010; DG CHEST 2 VIEW dated 06/14/2008; DG CHEST 2 VIEW dated 05/27/2008  FINDINGS The heart size and mediastinal contours are within normal limits. Both lungs are clear. The visualized skeletal structures are unremarkable.  IMPRESSION No active disease.  SIGNATURE  Electronically Signed   By: Esperanza Heir M.D.   On: 05/02/2013 14:29    Microbiology: No results found for this or any previous visit (from the past 240 hour(s)).   Labs: Basic Metabolic Panel:  Recent Labs Lab 05/02/13 1421 05/03/13 0358  NA 140 144  K 3.9 4.1  CL 100 105  CO2 25 26  GLUCOSE 117* 102*  BUN 12 14  CREATININE 0.72 0.78  CALCIUM 10.0 9.0   Liver Function Tests:  Recent Labs Lab 05/03/13 0358  AST 17  ALT 16  ALKPHOS 57  BILITOT 0.7  PROT 7.2  ALBUMIN 3.7   No results found for this basename: LIPASE, AMYLASE,  in the last 168 hours No results found for this basename: AMMONIA,  in the last 168 hours CBC:  Recent Labs Lab 05/02/13 1421 05/03/13 0358  WBC 6.5 5.2  NEUTROABS 4.5  --   HGB 14.3 13.1  HCT 42.3 39.3  MCV 92.2 93.6  PLT 302 271   Cardiac Enzymes:  Recent Labs Lab 05/02/13 1421 05/02/13 1604 05/02/13 2200 05/03/13 0358  TROPONINI <0.30 <0.30 <0.30 <0.30   BNP: BNP (last 3 results) No results found for this basename: PROBNP,  in the last 8760 hours CBG: No results found for this basename: GLUCAP,  in the last 168 hours     Signed:  Alize Borrayo  [facility] 05/03/2013, 3:53 PM

## 2013-05-03 NOTE — Discharge Planning (Signed)
Pt stated she was ready to go home and she had no pain.  Pts IV and tele were removed.  Pt was given DC papers and educated on future s/sx of infection or Cardiac related.  Pt also instructed what might cause need to call Dr. Or return to the hospital.  Pt will be walked to car by myself and family when ready.

## 2013-05-06 ENCOUNTER — Encounter: Payer: Self-pay | Admitting: *Deleted

## 2013-05-08 ENCOUNTER — Encounter: Payer: Self-pay | Admitting: Family Medicine

## 2013-05-08 ENCOUNTER — Ambulatory Visit (INDEPENDENT_AMBULATORY_CARE_PROVIDER_SITE_OTHER): Payer: BC Managed Care – PPO | Admitting: Family Medicine

## 2013-05-08 VITALS — BP 138/80 | Temp 98.5°F | Ht 63.0 in | Wt 180.0 lb

## 2013-05-08 DIAGNOSIS — R079 Chest pain, unspecified: Secondary | ICD-10-CM

## 2013-05-08 DIAGNOSIS — R946 Abnormal results of thyroid function studies: Secondary | ICD-10-CM

## 2013-05-08 DIAGNOSIS — R7989 Other specified abnormal findings of blood chemistry: Secondary | ICD-10-CM

## 2013-05-08 NOTE — Progress Notes (Signed)
   Subjective:    Patient ID: Morgan Hill, female    DOB: 06/26/1945, 68 y.o.   MRN: [id number]  HPI Patient was [facility] on 05/02/13 for chest discomfort. EKG and labwork was done and everything came back normal. Patient is scheduled to have a stress test done on 05/12/13. She states that she still has lightheadedness and weakness. No other concerns at this time.  I reviewed over the results of this patient's x-rays lab work and hospitalization.  They states that she was working and then that morning she felt a burning pulling sensation in her chest lasted for a period of time along with slight heaviness she went to the ER they ruled out MI they released her they have scheduled her this Friday for a stress echo. She does state occasionally gets belching. She denies any sweats chills nausea vomiting denies any fevers. She does stay with activity she gets short of breath but she attributes that to being out of shape  PMH Family history review patient does not smoke  Review of Systems  Constitutional: Negative for activity change, appetite change and fatigue.  HENT: Negative for congestion, ear discharge and rhinorrhea.   Eyes: Negative for discharge.  Respiratory: Positive for chest tightness. Negative for cough and wheezing.   Cardiovascular: Positive for chest pain.  Gastrointestinal: Negative for vomiting and abdominal pain.  Genitourinary: Negative for frequency and difficulty urinating.  Musculoskeletal: Negative for neck pain.  Neurological: Negative for weakness and headaches.  Psychiatric/Behavioral: Negative for behavioral problems.   See above.    Objective:   Physical Exam  Constitutional: She is oriented to person, place, and time. She appears well-developed and well-nourished.  HENT:  Head: Normocephalic.  Neck: No thyromegaly present.  Cardiovascular: Normal rate, regular rhythm, normal heart sounds and intact distal pulses.   No murmur  heard. Pulmonary/Chest: Effort normal and breath sounds normal. No respiratory distress. She has no wheezes.  Abdominal: Soft. Bowel sounds are normal. She exhibits no distension and no mass. There is no tenderness.  Musculoskeletal: Normal range of motion. She exhibits no edema and no tenderness.  Lymphadenopathy:    She has no cervical adenopathy.  Neurological: She is alert and oriented to person, place, and time. She exhibits normal muscle tone.  Skin: Skin is warm and dry.  Psychiatric: She has a normal mood and affect. Her behavior is normal.          Assessment & Plan:  #1 chest pain-cardiac does need to be ruled out but does not seem to be highly likely c. I believe that patient's best approach would be getting a stress test done to rule out cardiac. Patient was started on omeprazole to cover for reflux she is to followup here in several weeks if she is still having ongoing trouble we will refer her to gastroenterology for endoscopy. Patient does not need any scans at this point I do not feel she is having DVTs or pulmonary emboli

## 2013-05-12 ENCOUNTER — Encounter (HOSPITAL_COMMUNITY): Payer: Self-pay

## 2013-05-12 ENCOUNTER — Ambulatory Visit (HOSPITAL_COMMUNITY)
Admission: RE | Admit: 2013-05-12 | Discharge: 2013-05-12 | Disposition: A | Discharge status: Home / Self Care | Payer: BC Managed Care – PPO | Source: Ambulatory Visit | Attending: Family Medicine | Admitting: Family Medicine

## 2013-05-12 DIAGNOSIS — R072 Precordial pain: Secondary | ICD-10-CM

## 2013-05-12 DIAGNOSIS — E669 Obesity, unspecified: Secondary | ICD-10-CM | POA: Insufficient documentation

## 2013-05-12 DIAGNOSIS — R079 Chest pain, unspecified: Secondary | ICD-10-CM

## 2013-05-12 DIAGNOSIS — Z6832 Body mass index (BMI) 32.0-32.9, adult: Secondary | ICD-10-CM | POA: Insufficient documentation

## 2013-05-12 NOTE — Progress Notes (Signed)
*  PRELIMINARY RESULTS* Echocardiogram Echocardiogram Stress Test has been performed.  Morgan Hill 05/12/2013, 11:10 AM

## 2013-05-12 NOTE — Progress Notes (Signed)
Stress Lab Nurses Notes - [facility]  [facility] Dronerika C Desena 05/12/2013 Reason for doing test: Chest Pain Type of test: Stress Echo Nurse performing test: Parke PoissonPhyllis Billingsly, RN Nuclear Medicine Tech: Not Applicable Echo Tech: Veda Canningindy Rigg MD performing test: Koneswaran/K.Lyman BishopLawrence NP Family MD: Dr. Lilyan PuntScott Luking Test explained and consent signed: yes IV started: No IV started Symptoms: SOB Treatment/Intervention: None Reason test stopped: fatigue After recovery IV was: NA Patient to return to Nuc. Med at : NA Patient discharged: Home Patient's Condition upon discharge was: stable Comments: During test peak BP 204/72 & HR 150.  Recovery BP 172/54 & HR 90.  Symptoms resolved in recovery. Erskine SpeedBillingsley, Odus Clasby T

## 2013-05-24 ENCOUNTER — Encounter: Payer: Self-pay | Admitting: *Deleted

## 2013-05-24 ENCOUNTER — Encounter: Payer: Self-pay | Admitting: Adult Health

## 2013-05-24 ENCOUNTER — Ambulatory Visit (INDEPENDENT_AMBULATORY_CARE_PROVIDER_SITE_OTHER): Payer: BC Managed Care – PPO | Admitting: Adult Health

## 2013-05-24 VITALS — BP 147/66 | HR 81 | Ht 63.0 in | Wt 179.0 lb

## 2013-05-24 DIAGNOSIS — R079 Chest pain, unspecified: Secondary | ICD-10-CM

## 2013-05-24 DIAGNOSIS — K219 Gastro-esophageal reflux disease without esophagitis: Secondary | ICD-10-CM

## 2013-05-24 NOTE — Assessment & Plan Note (Signed)
Followed by Dr. Gerda DissLuking for this with PPI. He will follow this and make referrals to GI if he feels this to be clinically helpful to her.

## 2013-05-24 NOTE — Assessment & Plan Note (Signed)
Normal stress echo. No signs of ischemia or dysfunction. She is without further cardiac complaint. She will be seen on a PRN basis.

## 2013-05-24 NOTE — Progress Notes (Signed)
Name: Morgan Hill    DOB: 09/26/1945  Age: 68 y.o.  MR#: [id number]       PCP:  Sallee Lange, MD      Insurance: Payor: [place] / Plan: [employer]BS [place] PPO / Product Type: *No Product type* /   CC:    Chief Complaint  Patient presents with  . Chest Pain    VS Filed Vitals:   05/24/13 1348  BP: 147/66  Pulse: 81  Height: 5' 3"  (1.6 m)  Weight: 179 lb (81.194 kg)    Weights Current Weight  05/24/13 179 lb (81.194 kg)  05/08/13 180 lb (81.647 kg)  05/03/13 182 lb 1.6 oz (82.6 kg)    Blood Pressure  BP Readings from Last 3 Encounters:  05/24/13 147/66  05/08/13 138/80  05/03/13 117/55     Admit date:  (Not on file) Last encounter with RMR:  Visit date not found   Allergy Review of patient's allergies indicates no known allergies.  Current Outpatient Prescriptions  Medication Sig Dispense Refill  . nitroGLYCERIN (NITROSTAT) 0.4 MG SL tablet Place 1 tablet (0.4 mg total) under the tongue every 5 (five) minutes as needed for chest pain.  30 tablet  12  . omeprazole (PRILOSEC) 20 MG capsule Take 20 mg by mouth daily.       No current facility-administered medications for this visit.    Discontinued Meds:    Medications Discontinued During This Encounter  Medication Reason  . aspirin EC 81 MG EC tablet Error    Patient Active Problem List   Diagnosis Date Noted  . GERD (gastroesophageal reflux disease) 05/03/2013  . Elevated TSH 05/03/2013  . Chest pain 05/02/2013  . Obesity 05/02/2013    LABS    Component Value Date/Time   NA 144 05/03/2013 0358   NA 140 05/02/2013 1421   NA 131* 06/20/2010 2341   K 4.1 05/03/2013 0358   K 3.9 05/02/2013 1421   K 3.8 06/20/2010 2341   CL 105 05/03/2013 0358   CL 100 05/02/2013 1421   CL 99 06/20/2010 2341   CO2 26 05/03/2013 0358   CO2 25 05/02/2013 1421   CO2 25 06/20/2010 2341   GLUCOSE 102* 05/03/2013 0358   GLUCOSE 117* 05/02/2013 1421   GLUCOSE 97 06/20/2010 2341   BUN 14 05/03/2013 0358   BUN 12 05/02/2013 1421   BUN 14 06/20/2010 2341   CREATININE 0.78 05/03/2013 0358   CREATININE 0.72 05/02/2013 1421   CREATININE 0.69 06/20/2010 2341   CALCIUM 9.0 05/03/2013 0358   CALCIUM 10.0 05/02/2013 1421   CALCIUM 8.9 06/20/2010 2341   GFRNONAA 85* 05/03/2013 0358   GFRNONAA 88* 05/02/2013 1421   GFRNONAA >60 06/20/2010 2341   GFRAA >90 05/03/2013 0358   GFRAA >90 05/02/2013 1421   GFRAA  Value: >60        The eGFR has been calculated using the MDRD equation. This calculation has not been validated in all clinical situations. eGFR's persistently <60 mL/min signify possible Chronic Kidney Disease. 06/20/2010 2341   CMP     Component Value Date/Time   NA 144 05/03/2013 0358   K 4.1 05/03/2013 0358   CL 105 05/03/2013 0358   CO2 26 05/03/2013 0358   GLUCOSE 102* 05/03/2013 0358   BUN 14 05/03/2013 0358   CREATININE 0.78 05/03/2013 0358   CALCIUM 9.0 05/03/2013 0358   PROT 7.2 05/03/2013 0358   ALBUMIN 3.7 05/03/2013 0358   AST 17 05/03/2013 0358   ALT  16 05/03/2013 0358   ALKPHOS 57 05/03/2013 0358   BILITOT 0.7 05/03/2013 0358   GFRNONAA 85* 05/03/2013 0358   GFRAA >90 05/03/2013 0358       Component Value Date/Time   WBC 5.2 05/03/2013 0358   WBC 6.5 05/02/2013 1421   WBC 6.7 06/20/2010 2341   HGB 13.1 05/03/2013 0358   HGB 14.3 05/02/2013 1421   HGB 12.5 06/20/2010 2341   HCT 39.3 05/03/2013 0358   HCT 42.3 05/02/2013 1421   HCT 37.6 06/20/2010 2341   MCV 93.6 05/03/2013 0358   MCV 92.2 05/02/2013 1421   MCV 93.3 06/20/2010 2341    Lipid Panel     Component Value Date/Time   CHOL 189 05/03/2013 0358   TRIG 76 05/03/2013 0358   HDL 78 05/03/2013 0358   CHOLHDL 2.4 05/03/2013 0358   VLDL 15 05/03/2013 0358   LDLCALC 96 05/03/2013 0358    ABG No results found for this basename: phart, pco2, pco2art, po2, po2art, hco3, tco2, acidbasedef, o2sat     Lab Results  Component Value Date   TSH 6.491* 05/02/2013   BNP (last 3 results) No results found for this basename: PROBNP,  in the last 8760 hours Cardiac Panel (last 3  results) No results found for this basename: CKTOTAL, CKMB, TROPONINI, RELINDX,  in the last 72 hours  Iron/TIBC/Ferritin No results found for this basename: iron, tibc, ferritin     EKG Orders placed during the hospital encounter of 05/02/13  . ED EKG  . ED EKG  . EKG 12-LEAD  . EKG 12-LEAD  . EKG     Prior Assessment and Plan Problem List as of 05/24/2013     Digestive   GERD (gastroesophageal reflux disease)     Other   Obesity   Chest pain   Elevated TSH       Imaging: Dg Chest Portable 1 View  05/02/2013   CLINICAL DATA Chest pain and hypertension  EXAM PORTABLE CHEST - 1 VIEW  COMPARISON DG CHEST 2 VIEW dated 06/20/2010; DG CHEST 2 VIEW dated 06/14/2008; DG CHEST 2 VIEW dated 05/27/2008  FINDINGS The heart size and mediastinal contours are within normal limits. Both lungs are clear. The visualized skeletal structures are unremarkable.  IMPRESSION No active disease.  SIGNATURE  Electronically Signed   By: Skipper Cliche M.D.   On: 05/02/2013 14:29

## 2013-05-24 NOTE — Progress Notes (Signed)
    HPI: Morgan Hill is a 68 year old patient of Dr. Wyline MoodBranch we are seeing posthospitalization after admission for chest pain. The patient was ruled out for myocardial infarction, or ACS. She was scheduled for an outpatient stress echocardiogram. She was also treated for GERD.   Stress echocardiogram was completed on 05/12/2013 and was found to be normal, without evidence of wall motion abnormalities or ischemia. A normal ejection fraction.   She comes today without cardiac complaint. She has been recently started on PPI per Dr.Luking which has made a world of difference to her, without recurrent discomfort since beginning this medications.    No Known Allergies  Current Outpatient Prescriptions  Medication Sig Dispense Refill  . nitroGLYCERIN (NITROSTAT) 0.4 MG SL tablet Place 1 tablet (0.4 mg total) under the tongue every 5 (five) minutes as needed for chest pain.  30 tablet  12  . omeprazole (PRILOSEC) 20 MG capsule Take 20 mg by mouth daily.       No current facility-administered medications for this visit.    Past Medical History  Diagnosis Date  . GERD (gastroesophageal reflux disease)   . Back pain   . CTS (carpal tunnel syndrome)     right    History reviewed. No pertinent past surgical history.  ROS: Review of systems complete and found to be negative unless listed above  PHYSICAL EXAM BP 147/66  Pulse 81  Ht 5\' 3"  (1.6 m)  Wt 179 lb (81.194 kg)  BMI 31.72 kg/m2  General: Well developed, well nourished, in no acute distress Head: Eyes PERRLA, No xanthomas.   Normal cephalic and atramatic  Lungs: Clear bilaterally to auscultation and percussion. Heart: HRRR S1 S2, without MRG.  Pulses are 2+ & equal.            No carotid bruit. No JVD.  No abdominal bruits. No femoral bruits. Abdomen: Bowel sounds are positive, abdomen soft and non-tender without masses or                  Hernia's noted. Msk:  Back normal, normal gait. Normal strength and tone for  age. Extremities: No clubbing, cyanosis or edema.  DP +1 Neuro: Alert and oriented X 3. Psych:  Good affect, responds appropriately    ASSESSMENT AND PLAN

## 2013-05-24 NOTE — Patient Instructions (Signed)
Your physician recommends that you schedule a follow-up appointment in:  As needed  Your physician recommends that you continue on your current medications as directed. Please refer to the Current Medication list given to you today.  

## 2013-06-14 LAB — TSH: TSH: 7.592 u[IU]/mL — ABNORMAL HIGH (ref 0.350–4.500)

## 2013-06-14 LAB — T4, FREE: Free T4: 0.92 ng/dL (ref 0.80–1.80)

## 2013-06-20 ENCOUNTER — Encounter: Payer: Self-pay | Admitting: Family Medicine

## 2013-06-20 ENCOUNTER — Ambulatory Visit (INDEPENDENT_AMBULATORY_CARE_PROVIDER_SITE_OTHER): Payer: BC Managed Care – PPO | Admitting: Family Medicine

## 2013-06-20 VITALS — BP 138/82 | Ht 63.0 in | Wt 182.0 lb

## 2013-06-20 DIAGNOSIS — E039 Hypothyroidism, unspecified: Secondary | ICD-10-CM

## 2013-06-20 DIAGNOSIS — E038 Other specified hypothyroidism: Secondary | ICD-10-CM

## 2013-06-20 DIAGNOSIS — K219 Gastro-esophageal reflux disease without esophagitis: Secondary | ICD-10-CM

## 2013-06-20 NOTE — Progress Notes (Signed)
   Subjective:    Patient ID: Morgan Hill, female    DOB: 04/27/1945, 68 y.o.   MRN: [id number]  HPIFollow up on bloodwork and on chest pain. Patient  No longer having chest pain. Patient had a stress test.   Follow up on reflux. Taking omeprazole once daily. Reflux is much better. She relates she is taking omeprazole trying to watch her diet she had many questions regarding how long she has to take the medication. She states her symptoms are very well controlled.  She denies excessive thirst urination chest tightness pressure pain she denies dry skin constipation she does relate some fatigue denies snoring    Review of Systems See above    Objective:   Physical Exam  Lungs are clear hearts regular neck no masses eardrums normal blood pressure good throat normal      Assessment & Plan:  1. GERD (gastroesophageal reflux disease) Patient is under good control continue current measures for at least 3 months then try to taper off the medicine going to every other day for a couple weeks and then twice a week for a few weeks and then stopping the medicine. Hopefully she will get to the point where the medication is not necessary. She is altering her diet.  2. Subclinical hypothyroidism TSH slightly up compared where was recheck this again in 3 months time potentially will need to be on thyroid medicine if it continues to escalate

## 2013-09-12 ENCOUNTER — Telehealth: Payer: Self-pay | Admitting: Family Medicine

## 2013-09-12 DIAGNOSIS — R5383 Other fatigue: Secondary | ICD-10-CM

## 2013-09-12 DIAGNOSIS — R7303 Prediabetes: Secondary | ICD-10-CM

## 2013-09-12 DIAGNOSIS — R5381 Other malaise: Secondary | ICD-10-CM

## 2013-09-12 NOTE — Telephone Encounter (Signed)
TSH, free T4, glucose, hemoglobin A1c

## 2013-09-12 NOTE — Telephone Encounter (Signed)
Patient needs order for blood work. °

## 2013-09-12 NOTE — Telephone Encounter (Signed)
Patient had CBC, TSH, T4, Lipid, Liver, Met 7 on 3/15

## 2013-09-12 NOTE — Telephone Encounter (Signed)
She wants it done this week.

## 2013-09-13 NOTE — Telephone Encounter (Signed)
Blood work orders placed in Epic. Patient notified. 

## 2013-09-15 LAB — GLUCOSE, RANDOM: Glucose, Bld: 95 mg/dL (ref 70–99)

## 2013-09-15 LAB — TSH: TSH: 5.836 u[IU]/mL — AB (ref 0.350–4.500)

## 2013-09-15 LAB — T4, FREE: FREE T4: 0.92 ng/dL (ref 0.80–1.80)

## 2013-09-15 LAB — HEMOGLOBIN A1C
Hgb A1c MFr Bld: 6.3 % — ABNORMAL HIGH (ref <5.7)
MEAN PLASMA GLUCOSE: 134 mg/dL — AB (ref <117)

## 2013-09-19 NOTE — Telephone Encounter (Signed)
Patient notified and verbalized understanding. 

## 2013-10-27 ENCOUNTER — Ambulatory Visit: Payer: BC Managed Care – PPO | Admitting: Family Medicine

## 2013-11-02 ENCOUNTER — Encounter: Payer: Self-pay | Admitting: Family Medicine

## 2013-11-02 ENCOUNTER — Ambulatory Visit (INDEPENDENT_AMBULATORY_CARE_PROVIDER_SITE_OTHER): Payer: BC Managed Care – PPO | Admitting: Family Medicine

## 2013-11-02 VITALS — BP 132/82 | Ht 63.0 in | Wt 180.0 lb

## 2013-11-02 DIAGNOSIS — E119 Type 2 diabetes mellitus without complications: Secondary | ICD-10-CM | POA: Insufficient documentation

## 2013-11-02 DIAGNOSIS — K219 Gastro-esophageal reflux disease without esophagitis: Secondary | ICD-10-CM

## 2013-11-02 DIAGNOSIS — R7303 Prediabetes: Secondary | ICD-10-CM

## 2013-11-02 DIAGNOSIS — R7309 Other abnormal glucose: Secondary | ICD-10-CM

## 2013-11-02 MED ORDER — CITALOPRAM HYDROBROMIDE 10 MG PO TABS: 10.0000 mg | ORAL_TABLET | Freq: Every day | ORAL | Status: DC

## 2013-11-02 NOTE — Progress Notes (Signed)
   Subjective:    Patient ID: Morgan Hill, female    DOB: 1945-11-05, 68 y.o.   MRN: [id number]  HPI Patient arrives for a follow up on reflux. She states she is doing better with the medicine she try to taper off but she had reoccurrence of symptoms she is trying to eat healthy and stay physically active  Review of Systems Patient denies vomiting diarrhea chest pain abdominal pain    Objective:   Physical Exam Lungs clear heart regular pulse normal abdomen soft extremities no edema       Assessment & Plan:  Patient actually doing better with her reflux. She does need to take the medicine on a regular basis to keep it under control  She was encouraged to followup for wellness exam. She will need followup hemoglobin A1c in a few months

## 2013-11-02 NOTE — Patient Instructions (Addendum)
DASH Eating Plan DASH stands for "Dietary Approaches to Stop Hypertension." The DASH eating plan is a healthy eating plan that has been shown to reduce high blood pressure (hypertension). Additional health benefits may include reducing the risk of type 2 diabetes mellitus, heart disease, and stroke. The DASH eating plan may also help with weight loss. WHAT DO I NEED TO KNOW ABOUT THE DASH EATING PLAN? For the DASH eating plan, you will follow these general guidelines:  Choose foods with a percent daily value for sodium of less than 5% (as listed on the food label).  Use salt-free seasonings or herbs instead of table salt or sea salt.  Check with your health care provider or pharmacist before using salt substitutes.  Eat lower-sodium products, often labeled as "lower sodium" or "no salt added."  Eat fresh foods.  Eat more vegetables, fruits, and low-fat dairy products.  Choose whole grains. Look for the word "whole" as the first word in the ingredient list.  Choose fish and skinless chicken or turkey more often than red meat. Limit fish, poultry, and meat to 6 oz (170 g) each day.  Limit sweets, desserts, sugars, and sugary drinks.  Choose heart-healthy fats.  Limit cheese to 1 oz (28 g) per day.  Eat more home-cooked food and less restaurant, buffet, and fast food.  Limit fried foods.  Cook foods using methods other than frying.  Limit canned vegetables. If you do use them, rinse them well to decrease the sodium.  When eating at a restaurant, ask that your food be prepared with less salt, or no salt if possible. WHAT FOODS CAN I EAT? Seek help from a dietitian for individual calorie needs. Grains Whole grain or whole wheat bread. Brown rice. Whole grain or whole wheat pasta. Quinoa, bulgur, and whole grain cereals. Low-sodium cereals. Corn or whole wheat flour tortillas. Whole grain cornbread. Whole grain crackers. Low-sodium crackers. Vegetables Fresh or frozen vegetables  (raw, steamed, roasted, or grilled). Low-sodium or reduced-sodium tomato and vegetable juices. Low-sodium or reduced-sodium tomato sauce and paste. Low-sodium or reduced-sodium canned vegetables.  Fruits All fresh, canned (in natural juice), or frozen fruits. Meat and Other Protein Products Ground beef (85% or leaner), grass-fed beef, or beef trimmed of fat. Skinless chicken or turkey. Ground chicken or turkey. Pork trimmed of fat. All fish and seafood. Eggs. Dried beans, peas, or lentils. Unsalted nuts and seeds. Unsalted canned beans. Dairy Low-fat dairy products, such as skim or 1% milk, 2% or reduced-fat cheeses, low-fat ricotta or cottage cheese, or plain low-fat yogurt. Low-sodium or reduced-sodium cheeses. Fats and Oils Tub margarines without trans fats. Light or reduced-fat mayonnaise and salad dressings (reduced sodium). Avocado. Safflower, olive, or canola oils. Natural peanut or almond butter. Other Unsalted popcorn and pretzels. The items listed above may not be a complete list of recommended foods or beverages. Contact your dietitian for more options. WHAT FOODS ARE NOT RECOMMENDED? Grains White bread. White pasta. White rice. Refined cornbread. Bagels and croissants. Crackers that contain trans fat. Vegetables Creamed or fried vegetables. Vegetables in a cheese sauce. Regular canned vegetables. Regular canned tomato sauce and paste. Regular tomato and vegetable juices. Fruits Dried fruits. Canned fruit in light or heavy syrup. Fruit juice. Meat and Other Protein Products Fatty cuts of meat. Ribs, chicken wings, bacon, sausage, bologna, salami, chitterlings, fatback, hot dogs, bratwurst, and packaged luncheon meats. Salted nuts and seeds. Canned beans with salt. Dairy Whole or 2% milk, cream, half-and-half, and cream cheese. Whole-fat or sweetened yogurt. Full-fat   cheeses or blue cheese. Nondairy creamers and whipped toppings. Processed cheese, cheese spreads, or cheese  curds. Condiments Onion and garlic salt, seasoned salt, table salt, and sea salt. Canned and packaged gravies. Worcestershire sauce. Tartar sauce. Barbecue sauce. Teriyaki sauce. Soy sauce, including reduced sodium. Steak sauce. Fish sauce. Oyster sauce. Cocktail sauce. Horseradish. Ketchup and mustard. Meat flavorings and tenderizers. Bouillon cubes. Hot sauce. Tabasco sauce. Marinades. Taco seasonings. Relishes. Fats and Oils Butter, stick margarine, lard, shortening, ghee, and bacon fat. Coconut, palm kernel, or palm oils. Regular salad dressings. Other Pickles and olives. Salted popcorn and pretzels. The items listed above may not be a complete list of foods and beverages to avoid. Contact your dietitian for more information. WHERE CAN I FIND MORE INFORMATION? National Heart, Lung, and Blood Institute: [employer] Document Released: 01/29/2011 Document Revised: 06/26/2013 Document Reviewed: 12/14/2012 [facility] Patient Information 2015 [place], [place]. This information is not intended to   Diabetes Mellitus and Food It is important for you to manage your blood sugar (glucose) level. Your blood glucose level can be greatly affected by what you eat. Eating healthier foods in the appropriate amounts throughout the day at about the same time each day will help you control your blood glucose level. It can also help slow or prevent worsening of your diabetes mellitus. Healthy eating may even help you improve the level of your blood pressure and reach or maintain a healthy weight.  HOW CAN FOOD AFFECT ME? Carbohydrates Carbohydrates affect your blood glucose level more than any other type of food. Your dietitian will help you determine how many carbohydrates to eat at each meal and teach you how to count carbohydrates. Counting carbohydrates is important to keep your blood glucose at a healthy level, especially if you are using insulin or taking certain medicines  for diabetes mellitus. Alcohol Alcohol can cause sudden decreases in blood glucose (hypoglycemia), especially if you use insulin or take certain medicines for diabetes mellitus. Hypoglycemia can be a life-threatening condition. Symptoms of hypoglycemia (sleepiness, dizziness, and disorientation) are similar to symptoms of having too much alcohol.  If your health care provider has given you approval to drink alcohol, do so in moderation and use the following guidelines:  Women should not have more than one drink per day, and men should not have more than two drinks per day. One drink is equal to:  12 oz of beer.  5 oz of wine.  1 oz of hard liquor.  Do not drink on an empty stomach.  Keep yourself hydrated. Have water, diet soda, or unsweetened iced tea.  Regular soda, juice, and other mixers might contain a lot of carbohydrates and should be counted. WHAT FOODS ARE NOT RECOMMENDED? As you make food choices, it is important to remember that all foods are not the same. Some foods have fewer nutrients per serving than other foods, even though they might have the same number of calories or carbohydrates. It is difficult to get your body what it needs when you eat foods with fewer nutrients. Examples of foods that you should avoid that are high in calories and carbohydrates but low in nutrients include:  Trans fats (most processed foods list trans fats on the Nutrition Facts label).  Regular soda.  Juice.  Candy.  Sweets, such as cake, pie, doughnuts, and cookies.  Fried foods. WHAT FOODS CAN I EAT? Have nutrient-rich foods, which will nourish your body and keep you healthy. The food you should eat also will depend on several factors, including:  The calories you need.  The medicines you take.  Your weight.  Your blood glucose level.  Your blood pressure level.  Your cholesterol level. You also should eat a variety of foods, including:  Protein, such as meat, poultry,  fish, tofu, nuts, and seeds (lean animal proteins are best).  Fruits.  Vegetables.  Dairy products, such as milk, cheese, and yogurt (low fat is best).  Breads, grains, pasta, cereal, rice, and beans.  Fats such as olive oil, trans fat-free margarine, canola oil, avocado, and olives. DOES EVERYONE WITH DIABETES MELLITUS HAVE THE SAME MEAL PLAN? Because every person with diabetes mellitus is different, there is not one meal plan that works for everyone. It is very important that you meet with a dietitian who will help you create a meal plan that is just right for you. Document Released: 11/06/2004 Document Revised: 02/14/2013 Document Reviewed: 01/06/2013 [facility] Patient Information 2015 [place], [place]. This information is not intended to replace advice given to you by your health care provider. Make sure you discuss any questions you have with your health care provider. replace advice given to you by your health care provider. Make sure you discuss any questions you have with your health care provider.

## 2014-01-12 ENCOUNTER — Encounter: Payer: Self-pay | Admitting: Family Medicine

## 2014-01-12 ENCOUNTER — Ambulatory Visit (INDEPENDENT_AMBULATORY_CARE_PROVIDER_SITE_OTHER): Payer: BC Managed Care – PPO | Admitting: Family Medicine

## 2014-01-12 VITALS — BP 134/74 | Ht 63.5 in | Wt 177.0 lb

## 2014-01-12 DIAGNOSIS — Z23 Encounter for immunization: Secondary | ICD-10-CM

## 2014-01-12 DIAGNOSIS — R739 Hyperglycemia, unspecified: Secondary | ICD-10-CM

## 2014-01-12 LAB — POCT GLYCOSYLATED HEMOGLOBIN (HGB A1C): Hemoglobin A1C: 6.1

## 2014-01-12 MED ORDER — CITALOPRAM HYDROBROMIDE 10 MG PO TABS: 10.0000 mg | ORAL_TABLET | Freq: Every day | ORAL | Status: DC

## 2014-01-12 MED ORDER — OMEPRAZOLE 20 MG PO CPDR: 20.0000 mg | DELAYED_RELEASE_CAPSULE | Freq: Every day | ORAL | Status: DC

## 2014-01-12 NOTE — Progress Notes (Signed)
   Subjective:    Patient ID: Morgan Hill, female    DOB: 05/07/1945, 68 y.o.   MRN: [id number]  HPIPrediabetes. A1C 6.1.   Pt states she walks on the weekends. Could do better with her diet.   Rye bread   Declines flu vaccine Requesting pneumonia vaccine.   Has definitely helped. Would like to stay on it. Had tingling and it got better with the med.   Needs refill on citalopram. Pt states med is working well to help her anxiety. Patient notes less anxiety with current medication.  Patient has history of glucose intolerance. Trying to watch her diet. His sugars down.  Was having paresthesias symptoms earlier with anxiety. Patient reports that these have nearly resolved.  Compliant with reflux medication. No obvious side effects. Trying to watch caffeine in her diet.   No concerns.     Review of Systems No chest pain no headache no back pain no abdominal pain no change habits no blood in stool ROS otherwise negative    Objective:   Physical Exam  Alert no acute distress. Vitals stable. HEENT normal. Lungs clear. Heart regular in rhythm. Abdomen benign. Ankles without edema.      Assessment & Plan:  Impression 1 reflux stable #2 allergic rhinitis improved. #3 anxiety with physical symptomatology clinically improved on medication. #4 impaired fasting glucose. A1c still reveals no diabetes and improved at 6.1 plan follow-up as scheduled. Appropriate vaccines. Diet exercise discussed. WSL

## 2014-04-17 ENCOUNTER — Encounter: Payer: Self-pay | Admitting: Family Medicine

## 2014-04-17 ENCOUNTER — Ambulatory Visit (INDEPENDENT_AMBULATORY_CARE_PROVIDER_SITE_OTHER): Payer: BLUE CROSS/BLUE SHIELD | Admitting: Family Medicine

## 2014-04-17 VITALS — BP 128/82 | Ht 63.5 in | Wt 183.2 lb

## 2014-04-17 DIAGNOSIS — K219 Gastro-esophageal reflux disease without esophagitis: Secondary | ICD-10-CM

## 2014-04-17 MED ORDER — SUCRALFATE 1 G PO TABS: 1.0000 g | ORAL_TABLET | Freq: Three times a day (TID) | ORAL | Status: DC

## 2014-04-17 NOTE — Progress Notes (Signed)
   Subjective:    Patient ID: Morgan Hill, female    DOB: 09/13/1945, 69 y.o.   MRN: [id number]  HPI Patient arrives with complaint of nausea and light headed.   Patient notes chest discomfort off and on for years. Had a thorough cardiac workup just year ago. Developed depression and the chest is morning.  Has been cutting back on her omeprazole recently 1 every other day or so. Next  Certain foods separately cause reflux.  Significant nausea today next  Patient notes little mild achiness in the left shoulder.  Patient also felt lightheaded with her symptomatology.  Trying to cut down caffeine intake.   Review of Systems No headache no true chest pain no abdominal pain no diaphoresis no change in bowel habits    Objective:   Physical Exam Alert no apparent distress talkative HEENT normal. Lungs clear. Heart rare rhythm abdomen completely benign chest nontender lungs clear.       Assessment & Plan:  Patient arrives for a very protracted discussion regarding all of her symptoms impression 1 nonspecific symptomatology likely related to flare of reflux discussed. Plan Carafate 1 g before meals and at bedtime rationale discussed. Side effects discussed. Go back to omeprazole faithfully. Hold off on GI referral at this time. Easily 25 minutes spent most in discussion. Warning signs discussed WSL

## 2014-07-13 ENCOUNTER — Ambulatory Visit (INDEPENDENT_AMBULATORY_CARE_PROVIDER_SITE_OTHER): Payer: BLUE CROSS/BLUE SHIELD | Admitting: Family Medicine

## 2014-07-13 ENCOUNTER — Encounter: Payer: Self-pay | Admitting: Family Medicine

## 2014-07-13 VITALS — BP 130/74 | Ht 63.5 in | Wt 188.0 lb

## 2014-07-13 DIAGNOSIS — R7309 Other abnormal glucose: Secondary | ICD-10-CM | POA: Diagnosis not present

## 2014-07-13 DIAGNOSIS — J301 Allergic rhinitis due to pollen: Secondary | ICD-10-CM | POA: Diagnosis not present

## 2014-07-13 DIAGNOSIS — K219 Gastro-esophageal reflux disease without esophagitis: Secondary | ICD-10-CM | POA: Diagnosis not present

## 2014-07-13 DIAGNOSIS — F329 Major depressive disorder, single episode, unspecified: Secondary | ICD-10-CM

## 2014-07-13 DIAGNOSIS — F32A Depression, unspecified: Secondary | ICD-10-CM

## 2014-07-13 DIAGNOSIS — R7303 Prediabetes: Secondary | ICD-10-CM

## 2014-07-13 LAB — POCT GLYCOSYLATED HEMOGLOBIN (HGB A1C): HEMOGLOBIN A1C: 5.8

## 2014-07-13 MED ORDER — CITALOPRAM HYDROBROMIDE 10 MG PO TABS: 10.0000 mg | ORAL_TABLET | Freq: Every day | ORAL | Status: DC

## 2014-07-13 MED ORDER — OMEPRAZOLE 20 MG PO CPDR: 20.0000 mg | DELAYED_RELEASE_CAPSULE | Freq: Every day | ORAL | Status: DC

## 2014-07-13 NOTE — Progress Notes (Signed)
   Subjective:    Patient ID: Morgan Hill, female    DOB: 12/09/1945, 69 y.o.   MRN: [id number] Patient arrives office for follow-up of multiple concerns. HPI Patient is here today for a follow up visit on prediabetes. Patient states that she is doing very well. Patient has no concerns at this time. Currently takes no medications for her elevated blood sugar. She has been working very hard on sugar intake.  Results for orders placed or performed in visit on 07/13/14  POCT glycosylated hemoglobin (Hb A1C)  Result Value Ref Range   Hemoglobin A1C 5.8    Citalopram helps tremendously. Overall stress is much better. Sleeping much better. More energy.  Reflux is calm down considerably. This remained so when she takes her proton pump inhibitor faithfully. Minimal symptomatology  Sleeping better more energy  ,   Watching diet more closely, watching less sugars in the diewt  Review of Systems No headache no chest pain no back pain no change in bowel habits    Objective:   Physical Exam Alert vitals stable blood pressure excellent HEENT within normal limits lungs clear. Heart regular rhythm abdominal exam benign       Assessment & Plan:  Impression 1 prediabetes clinically improved discussed #2 reflux clinically improved discussed #3 depression much improved #4 insomnia. Plan diet exercise discussed maintain same. No change medications. Recheck in 6 months. WSL

## 2014-07-14 DIAGNOSIS — F329 Major depressive disorder, single episode, unspecified: Secondary | ICD-10-CM | POA: Insufficient documentation

## 2014-07-14 DIAGNOSIS — F32A Depression, unspecified: Secondary | ICD-10-CM | POA: Insufficient documentation

## 2015-01-11 ENCOUNTER — Encounter: Payer: Self-pay | Admitting: Family Medicine

## 2015-01-11 ENCOUNTER — Ambulatory Visit (INDEPENDENT_AMBULATORY_CARE_PROVIDER_SITE_OTHER): Payer: BLUE CROSS/BLUE SHIELD | Admitting: Family Medicine

## 2015-01-11 VITALS — BP 138/74 | Ht 64.0 in | Wt 196.0 lb

## 2015-01-11 DIAGNOSIS — F32A Depression, unspecified: Secondary | ICD-10-CM

## 2015-01-11 DIAGNOSIS — F329 Major depressive disorder, single episode, unspecified: Secondary | ICD-10-CM | POA: Diagnosis not present

## 2015-01-11 DIAGNOSIS — Z23 Encounter for immunization: Secondary | ICD-10-CM | POA: Diagnosis not present

## 2015-01-11 DIAGNOSIS — K219 Gastro-esophageal reflux disease without esophagitis: Secondary | ICD-10-CM | POA: Diagnosis not present

## 2015-01-11 DIAGNOSIS — R7303 Prediabetes: Secondary | ICD-10-CM | POA: Diagnosis not present

## 2015-01-11 LAB — POCT GLYCOSYLATED HEMOGLOBIN (HGB A1C): HEMOGLOBIN A1C: 6.2

## 2015-01-11 MED ORDER — CITALOPRAM HYDROBROMIDE 10 MG PO TABS: 10.0000 mg | ORAL_TABLET | Freq: Every day | ORAL | Status: DC

## 2015-01-11 MED ORDER — OMEPRAZOLE 20 MG PO CPDR: 20.0000 mg | DELAYED_RELEASE_CAPSULE | Freq: Every day | ORAL | Status: DC

## 2015-01-11 NOTE — Progress Notes (Signed)
   Subjective:    Patient ID: Morgan Hill, female    DOB: 02/27/1945, 69 y.o.   MRN: [id number]  HPI prediabetes. A1C today. Results for orders placed or performed in visit on 01/11/15  POCT glycosylated hemoglobin (Hb A1C)  Result Value Ref Range   Hemoglobin A1C 6.2    watching sugar intake. Mostly has cut sugar out of diet but not always. Realizes that she is at risk for diabetes.   Not exercising regullly ,was exercising before, but not now  ,  Knees hurting and achey right greater than left. Takes occasional over-the-counter medication.   needs refill on celexa ,   Still derinitely helping. No sig prob now woith mood problems. No side effects meds reviewed today  and omeprazole. Takes every day, taking qod does not work, states definitely needs it. Significant substernal burning and pressure without the medication    Flu vaccine today.   No concerns or problems.     Review of Systems No headache no chest pain no back pain no abdominal pain no change in bowel habits ROS otherwise negative    Objective:   Physical Exam  Alert vitals stable. HEENT normal lungs clear heart regular rate and rhythm. Neuro exam intact knee crepitations right greater than left      Assessment & Plan:  Impression prediabetes worsening A1c reviewed today diet exercise reviewed #2 depression clinical stable needs meds will maintain #3 reflux clinically stable benefited by Ms. #4 arthritis ongoing somewhat worsening over-the-counter agents out of plan as noted above plus flu shot follow-up in 6 months diet exercise discussed WSL

## 2015-07-18 ENCOUNTER — Encounter: Payer: Self-pay | Admitting: Family Medicine

## 2015-07-18 ENCOUNTER — Ambulatory Visit (INDEPENDENT_AMBULATORY_CARE_PROVIDER_SITE_OTHER): Payer: BLUE CROSS/BLUE SHIELD | Admitting: Family Medicine

## 2015-07-18 VITALS — BP 128/72 | Ht 64.0 in | Wt 195.4 lb

## 2015-07-18 DIAGNOSIS — F32A Depression, unspecified: Secondary | ICD-10-CM

## 2015-07-18 DIAGNOSIS — K219 Gastro-esophageal reflux disease without esophagitis: Secondary | ICD-10-CM | POA: Diagnosis not present

## 2015-07-18 DIAGNOSIS — F329 Major depressive disorder, single episode, unspecified: Secondary | ICD-10-CM

## 2015-07-18 DIAGNOSIS — R7303 Prediabetes: Secondary | ICD-10-CM | POA: Diagnosis not present

## 2015-07-18 DIAGNOSIS — M129 Arthropathy, unspecified: Secondary | ICD-10-CM

## 2015-07-18 DIAGNOSIS — M1711 Unilateral primary osteoarthritis, right knee: Secondary | ICD-10-CM

## 2015-07-18 LAB — POCT GLYCOSYLATED HEMOGLOBIN (HGB A1C): HEMOGLOBIN A1C: 5.6

## 2015-07-18 MED ORDER — OMEPRAZOLE 20 MG PO CPDR: 20.0000 mg | DELAYED_RELEASE_CAPSULE | Freq: Every day | ORAL | Status: DC

## 2015-07-18 MED ORDER — CITALOPRAM HYDROBROMIDE 10 MG PO TABS: 10.0000 mg | ORAL_TABLET | Freq: Every day | ORAL | Status: DC

## 2015-07-18 NOTE — Progress Notes (Signed)
   Subjective:    Patient ID: Morgan Hill, female    DOB: 10/20/1945, 70 y.o.   MRN: [id number] Patient arrives office for numerous concerns Gastroesophageal Reflux This is a chronic problem. The current episode started more than 1 year ago. Treatments tried: Omeprazole.   try to wean off the omeprazole simply cannot handle that.   Working on sugar diet, and has cut down sweets in diet, overall doing better compliant with her sugar diet watches closely she can't.   Patient has concerns of right knee pain. Ongoing challenges with right knee pain, stand the whole day at work, does not sit down. Occasional Aleve helps it.  Continues to use Celexa. States deftly helping. No obvious side effects with the. No frank depression. Overall good control of symptoms  Worse with movement and up stairs      Results for orders placed or performed in visit on 07/18/15  POCT HgB A1C  Result Value Ref Range   Hemoglobin A1C 5.6      Review of Systems No headache, no major weight loss or weight gain, no chest pain no back pain abdominal pain no change in bowel habits complete ROS otherwise negative     Objective:   Physical Exam Alert vital stable HEENT normal blood pressure good on repeat lungs clear heart regular rhythm abdominal exam completely benign. Right knees substantial crepitations no effusion no joint laxity       Assessment & Plan:  Impression right knee arthritis discussed length #2 reflux discussed at length patient wishes maintain same meds from 3 depression ongoing discussed needs to maintain meds #4 impaired fasting glucose. A1c stable handling diet well plan maintain all medications. Diet exercise discussed. May use glucosamine conjoint sulfate patient was wondering about this. Screening wellness exam strongly encouraged with Eber Jonesarolyn. Recheck in 6 months WSL

## 2016-01-17 ENCOUNTER — Encounter: Payer: Self-pay | Admitting: Family Medicine

## 2016-01-17 ENCOUNTER — Ambulatory Visit (INDEPENDENT_AMBULATORY_CARE_PROVIDER_SITE_OTHER): Payer: BLUE CROSS/BLUE SHIELD | Admitting: Family Medicine

## 2016-01-17 VITALS — BP 126/74 | Ht 64.0 in | Wt 198.0 lb

## 2016-01-17 DIAGNOSIS — K219 Gastro-esophageal reflux disease without esophagitis: Secondary | ICD-10-CM | POA: Diagnosis not present

## 2016-01-17 DIAGNOSIS — R739 Hyperglycemia, unspecified: Secondary | ICD-10-CM | POA: Diagnosis not present

## 2016-01-17 DIAGNOSIS — R7303 Prediabetes: Secondary | ICD-10-CM

## 2016-01-17 DIAGNOSIS — F321 Major depressive disorder, single episode, moderate: Secondary | ICD-10-CM

## 2016-01-17 LAB — POCT GLYCOSYLATED HEMOGLOBIN (HGB A1C): Hemoglobin A1C: 6

## 2016-01-17 NOTE — Progress Notes (Signed)
   Subjective:    Patient ID: Morgan Hill, female    DOB: 10/20/1945, 70 y.o.   MRN: [id number]  HPIprediabetes. Pt states she walks when she can. Usually just weekends. health conscious with diet.  Results for orders placed or performed in visit on 01/17/16  POCT glycosylated hemoglobin (Hb A1C)  Result Value Ref Range   Hemoglobin A1C 6.0   overall watching sugar intake. Not walking as much mostly on, works at [employer]   Needs refills on celexa,States overall controlling mood well. Without it has periods of moodiness and feeling down. No suicidal nor homicidal thoughts. No obvious side effects from medications   and omeprazole. , tries to skip a day on occa, but cannot handle it well, occas wine intake, flares up the reflux   134 8 No concerns today.   Declines flu vaccin, pt declines for now        Review of Systems No headache, no major weight loss or weight gain, no chest pain no back pain abdominal pain no change in bowel habits complete ROS otherwise negative     Objective:   Physical Exam Alert vitals stable, NAD. Blood pressure good on repeat. HEENT normal. Lungs clear. Heart regular rate and rhythm. Abdominal exam benign       Assessment & Plan:  Impression 1 prediabetes A1c fairly stable. Ongoing challenges discussed including diet exercise and appropriate low sugar diet #2 depression clinically stable to maintain same meds #3 reflux unable to take Zantac alone. Will stop Zantac go on up to omeprazole rationale discussed WSL

## 2016-01-26 ENCOUNTER — Other Ambulatory Visit: Payer: Self-pay | Admitting: Family Medicine

## 2016-04-13 ENCOUNTER — Other Ambulatory Visit: Payer: Self-pay | Admitting: *Deleted

## 2016-04-13 MED ORDER — CITALOPRAM HYDROBROMIDE 10 MG PO TABS: 10.0000 mg | ORAL_TABLET | Freq: Every day | ORAL | 0 refills | Status: DC

## 2016-06-26 ENCOUNTER — Ambulatory Visit (INDEPENDENT_AMBULATORY_CARE_PROVIDER_SITE_OTHER): Payer: Medicare HMO | Admitting: Family Medicine

## 2016-06-26 ENCOUNTER — Encounter: Payer: Self-pay | Admitting: Family Medicine

## 2016-06-26 VITALS — BP 122/76 | Ht 64.0 in | Wt 192.2 lb

## 2016-06-26 DIAGNOSIS — R69 Illness, unspecified: Secondary | ICD-10-CM | POA: Diagnosis not present

## 2016-06-26 DIAGNOSIS — F321 Major depressive disorder, single episode, moderate: Secondary | ICD-10-CM

## 2016-06-26 DIAGNOSIS — R7303 Prediabetes: Secondary | ICD-10-CM | POA: Diagnosis not present

## 2016-06-26 DIAGNOSIS — K219 Gastro-esophageal reflux disease without esophagitis: Secondary | ICD-10-CM | POA: Diagnosis not present

## 2016-06-26 DIAGNOSIS — R946 Abnormal results of thyroid function studies: Secondary | ICD-10-CM

## 2016-06-26 DIAGNOSIS — R7989 Other specified abnormal findings of blood chemistry: Secondary | ICD-10-CM

## 2016-06-26 LAB — POCT GLYCOSYLATED HEMOGLOBIN (HGB A1C): Hemoglobin A1C: 6.3

## 2016-06-26 MED ORDER — CITALOPRAM HYDROBROMIDE 10 MG PO TABS: 10.0000 mg | ORAL_TABLET | Freq: Every day | ORAL | 1 refills | Status: DC

## 2016-06-26 MED ORDER — OMEPRAZOLE 20 MG PO CPDR: DELAYED_RELEASE_CAPSULE | ORAL | 5 refills | Status: DC

## 2016-06-26 NOTE — Progress Notes (Signed)
   Subjective:    Patient ID: Morgan Hill, female    DOB: 08/24/1945, 71 y.o.   MRN: [id number]  HPI Patient arrives for a follow up on prediabetes. Patient reports no problems or concerns.  Results for orders placed or performed in visit on 06/26/16  POCT glycosylated hemoglobin (Hb A1C)  Result Value Ref Range   Hemoglobin A1C 6.3     Working hard standing all day Harder and harder to keep up, Often substantial fatigue. Review of prior blood work reveals patient had an significant elevated TSH several years ago. Since then has declined to get further blood work.  Patient has known history of prediabetes. Attempting to cut down sugar intake. Exercising some but not a lot. A1c was 6.06 months ago.  Patient notes ongoing compliance with antidepressant medication. No obvious side effects. Reports does not miss a dose. Overall continues to help depression substantially. No thoughts of homicide or suicide. Would like to maintain medication.  Patient notes ongoing challenges with reflux. Without the medication is major challenges. Not able to go without it.  Ongoing allergic rhinitis using Flonase when necessary with some numbness   Gets chronic back pain and leg pain from exrcise and work, Generally responds to regular stretching exercises and when necessary 20 minutes   Review of Systems No headache, no major weight loss or weight gain, no chest pain no back pain abdominal pain no change in bowel habits complete ROS otherwise negative     Objective:   Physical Exam Alert and oriented, vitals reviewed and stable, NAD ENT-TM's and ext canals WNL bilat via otoscopic exam Soft palate, tonsils and post pharynx WNL via oropharyngeal exam Neck-symmetric, no masses; thyroid nonpalpable and nontender Pulmonary-no tachypnea or accessory muscle use; Clear without wheezes via auscultation Card--no abnrml murmurs, rhythm reg and rate WNL Carotid pulses symmetric, without  bruits Impression 1 prediabetes numbers worsening discuss need to work harder on diet and exercise. Next  #2 reflux ongoing challenges with need for meds discussed maintain next  #3 depression clinically stable. Feels that point benefits from the Celexa no obvious side effects to maintain compliance discussed  #4 history of elevated TSH. Patient reluctant to get further blood work at this time. Despite my encouragement otherwise. Follow-up in 6 months       Assessment & Plan:

## 2016-12-20 ENCOUNTER — Other Ambulatory Visit: Payer: Self-pay | Admitting: Family Medicine

## 2017-01-01 ENCOUNTER — Telehealth: Payer: Self-pay | Admitting: Family Medicine

## 2017-01-01 DIAGNOSIS — Z79899 Other long term (current) drug therapy: Secondary | ICD-10-CM

## 2017-01-01 DIAGNOSIS — R7303 Prediabetes: Secondary | ICD-10-CM

## 2017-01-01 DIAGNOSIS — Z13220 Encounter for screening for lipoid disorders: Secondary | ICD-10-CM

## 2017-01-01 NOTE — Telephone Encounter (Signed)
Pt has had Lipid,cbc,cmet,Troponin I Tsh march 2015,T4 06/13/2016,A1c 06/26/2016. Please advise.

## 2017-01-01 NOTE — Telephone Encounter (Signed)
Patient is aware. Orders sent. 

## 2017-01-01 NOTE — Telephone Encounter (Signed)
Call [phone] when complete.

## 2017-01-01 NOTE — Telephone Encounter (Signed)
Requesting orders for labs for appointment on 01/22/17 with Dr. Brett CanalesSteve.

## 2017-01-01 NOTE — Telephone Encounter (Signed)
Lip liv m7 

## 2017-01-08 ENCOUNTER — Other Ambulatory Visit: Payer: Self-pay | Admitting: Family Medicine

## 2017-01-08 ENCOUNTER — Telehealth: Payer: Self-pay | Admitting: Family Medicine

## 2017-01-08 DIAGNOSIS — R7303 Prediabetes: Secondary | ICD-10-CM

## 2017-01-08 DIAGNOSIS — Z13220 Encounter for screening for lipoid disorders: Secondary | ICD-10-CM | POA: Diagnosis not present

## 2017-01-08 DIAGNOSIS — Z79899 Other long term (current) drug therapy: Secondary | ICD-10-CM | POA: Diagnosis not present

## 2017-01-08 DIAGNOSIS — R7989 Other specified abnormal findings of blood chemistry: Secondary | ICD-10-CM

## 2017-01-08 LAB — LIPID PANEL
CHOL/HDL RATIO: 2.7 (calc) (ref <5.0)
Cholesterol: 203 mg/dL — ABNORMAL HIGH (ref <200)
HDL: 74 mg/dL (ref >50)
LDL Cholesterol (Calc): 112 mg/dL (calc) — ABNORMAL HIGH
NON-HDL CHOLESTEROL (CALC): 129 mg/dL (ref <130)
TRIGLYCERIDES: 78 mg/dL (ref <150)

## 2017-01-08 LAB — HEPATIC FUNCTION PANEL
AG RATIO: 1.5 (calc) (ref 1.0–2.5)
ALBUMIN MSPROF: 4.1 g/dL (ref 3.6–5.1)
ALKALINE PHOSPHATASE (APISO): 62 U/L (ref 33–130)
ALT: 19 U/L (ref 6–29)
AST: 18 U/L (ref 10–35)
BILIRUBIN TOTAL: 0.7 mg/dL (ref 0.2–1.2)
Bilirubin, Direct: 0.1 mg/dL (ref 0.0–0.2)
Globulin: 2.8 g/dL (calc) (ref 1.9–3.7)
Indirect Bilirubin: 0.6 mg/dL (calc) (ref 0.2–1.2)
TOTAL PROTEIN: 6.9 g/dL (ref 6.1–8.1)

## 2017-01-08 LAB — BASIC METABOLIC PANEL WITH GFR
BUN: 14 mg/dL (ref 7–25)
CALCIUM: 9.1 mg/dL (ref 8.6–10.4)
CO2: 27 mmol/L (ref 20–32)
CREATININE: 0.73 mg/dL (ref 0.60–0.93)
Chloride: 106 mmol/L (ref 98–110)
GFR, EST NON AFRICAN AMERICAN: 83 mL/min/{1.73_m2} (ref >=60)
GFR, Est African American: 97 mL/min/{1.73_m2} (ref >=60)
GLUCOSE: 108 mg/dL — AB (ref 65–99)
Potassium: 4.6 mmol/L (ref 3.5–5.3)
Sodium: 141 mmol/L (ref 135–146)

## 2017-01-08 NOTE — Telephone Encounter (Signed)
Spoke with patient and informed her that we are going to add TSH, and Hemoglobin A1c. Patient verbalized understanding.

## 2017-01-08 NOTE — Telephone Encounter (Signed)
Left message return call 01/08/17 

## 2017-01-08 NOTE — Telephone Encounter (Signed)
I see lip liv m7 already ordered?? aDD tsh and A1c and make sure has  o v soon

## 2017-01-08 NOTE — Addendum Note (Signed)
Addended by: Theodora BlowREWS, SHANNON R on: 01/08/2017 11:25 AM   Modules accepted: Orders

## 2017-01-08 NOTE — Telephone Encounter (Signed)
Patient went to [facility] for blood work orders. [facility] called stating that the patient was under the impression that we were supposed to order a TSH to check her thyroid.

## 2017-01-22 ENCOUNTER — Encounter: Payer: Self-pay | Admitting: Family Medicine

## 2017-01-22 ENCOUNTER — Ambulatory Visit (INDEPENDENT_AMBULATORY_CARE_PROVIDER_SITE_OTHER): Payer: Medicare HMO | Admitting: Family Medicine

## 2017-01-22 VITALS — BP 170/82 | Ht 64.0 in | Wt 189.0 lb

## 2017-01-22 DIAGNOSIS — R69 Illness, unspecified: Secondary | ICD-10-CM | POA: Diagnosis not present

## 2017-01-22 DIAGNOSIS — F321 Major depressive disorder, single episode, moderate: Secondary | ICD-10-CM

## 2017-01-22 DIAGNOSIS — R7303 Prediabetes: Secondary | ICD-10-CM | POA: Diagnosis not present

## 2017-01-22 DIAGNOSIS — R7989 Other specified abnormal findings of blood chemistry: Secondary | ICD-10-CM | POA: Diagnosis not present

## 2017-01-22 LAB — TSH: TSH: 6.31 m[IU]/L — ABNORMAL HIGH (ref 0.40–4.50)

## 2017-01-22 LAB — POCT GLYCOSYLATED HEMOGLOBIN (HGB A1C): Hemoglobin A1C: 5.4

## 2017-01-22 LAB — HEMOGLOBIN A1C
HEMOGLOBIN A1C: 6 %{Hb} — AB (ref <5.7)
Mean Plasma Glucose: 126 (calc)
eAG (mmol/L): 7 (calc)

## 2017-01-22 MED ORDER — OMEPRAZOLE 20 MG PO CPDR: DELAYED_RELEASE_CAPSULE | ORAL | 5 refills | Status: DC

## 2017-01-22 MED ORDER — CITALOPRAM HYDROBROMIDE 10 MG PO TABS: 10.0000 mg | ORAL_TABLET | Freq: Every day | ORAL | 1 refills | Status: DC

## 2017-01-22 NOTE — Progress Notes (Signed)
   Subjective:    Patient ID: Morgan Hill, female    DOB: 09/29/1945, 71 y.o.   MRN: [id number]  HPI  Patient is here today to follow up on Prediabetes. She is currently trying to manage this with her die  t. She is on celexa 10 mg one daily and thinks this medication is work just fine for her.   Patient notes ongoing compliance with antidepressant medication. No obvious side effects. Reports does not miss a dose. Overall continues to help depression substantially. No thoughts of homicide or suicide. Would like to maintain medication.  Fatigue at times, working full time, on feet a ll day log, unable to o tired,  Retiring shortly   [employer] hx of htn  Sone vertigo late 1++ She declines the flu shot today, I documented this .  Results for orders placed or performed in visit on 01/22/17  POCT glycosylated hemoglobin (Hb A1C)  Result Value Ref Range   Hemoglobin A1C 5.4    Ongoing challenges with prediabetes.  Working on sugar intake.  Exercising some but not a lot because of work Review of Systems No headache, no major weight loss or weight gain, no chest pain no back pain abdominal pain no change in bowel habits complete ROS otherwise negative     Objective:   Physical Exam Alert and oriented, vitals reviewed and stable, NAD ENT-TM's and ext canals WNL bilat via otoscopic exam Soft palate, tonsils and post pharynx WNL via oropharyngeal exam Neck-symmetric, no masses; thyroid nonpalpable and nontender Pulmonary-no tachypnea or accessory muscle use; Clear without wheezes via auscultation Card--no abnrml murmurs, rhythm reg and rate WNL Carotid pulses symmetric, without bruits        Assessment & Plan:  Impression 1 prediabetes clinic with stable discussed to maintain  2.  Fatigue multifactorial discussed.  History of borderline TSH.  Need to check another level.  Addendum TSH came back just barely elevated.  It has been this way for years.  I do not think this is true  thyroid disease.  Discussed.  3.  Depression clinically stable to maintain same meds

## 2017-01-26 ENCOUNTER — Telehealth: Payer: Self-pay | Admitting: Family Medicine

## 2017-01-26 NOTE — Telephone Encounter (Signed)
Dr. Brett CanalesSteve,  This patient was seen on 01/22/17 and Crystal asked me to delete the charge for the A1C when it comes through.  The notes are still pending so the charges haven't dropped yet.  The office note is listed under Crystal's name and not yours so I don't know if this effects anything on your end.  I will still watch for the charge so I can remove it.  Thanks

## 2017-04-09 ENCOUNTER — Emergency Department (HOSPITAL_COMMUNITY): Payer: Medicare HMO

## 2017-04-09 ENCOUNTER — Telehealth: Payer: Self-pay | Admitting: *Deleted

## 2017-04-09 ENCOUNTER — Emergency Department (HOSPITAL_COMMUNITY)
Admission: EM | Admit: 2017-04-09 | Discharge: 2017-04-09 | Disposition: A | Discharge status: Home / Self Care | Payer: Medicare HMO | Attending: Emergency Medicine | Admitting: Emergency Medicine

## 2017-04-09 ENCOUNTER — Encounter (HOSPITAL_COMMUNITY): Payer: Self-pay | Admitting: Emergency Medicine

## 2017-04-09 DIAGNOSIS — Z87891 Personal history of nicotine dependence: Secondary | ICD-10-CM | POA: Insufficient documentation

## 2017-04-09 DIAGNOSIS — R0789 Other chest pain: Secondary | ICD-10-CM | POA: Diagnosis not present

## 2017-04-09 DIAGNOSIS — J189 Pneumonia, unspecified organism: Secondary | ICD-10-CM | POA: Diagnosis not present

## 2017-04-09 DIAGNOSIS — R0602 Shortness of breath: Secondary | ICD-10-CM | POA: Diagnosis not present

## 2017-04-09 DIAGNOSIS — Z79899 Other long term (current) drug therapy: Secondary | ICD-10-CM | POA: Insufficient documentation

## 2017-04-09 DIAGNOSIS — R079 Chest pain, unspecified: Secondary | ICD-10-CM | POA: Diagnosis not present

## 2017-04-09 LAB — COMPREHENSIVE METABOLIC PANEL
ALBUMIN: 4.1 g/dL (ref 3.5–5.0)
ALT: 21 U/L (ref 14–54)
ANION GAP: 12 (ref 5–15)
AST: 22 U/L (ref 15–41)
Alkaline Phosphatase: 65 U/L (ref 38–126)
BUN: 16 mg/dL (ref 6–20)
CHLORIDE: 102 mmol/L (ref 101–111)
CO2: 23 mmol/L (ref 22–32)
Calcium: 9.3 mg/dL (ref 8.9–10.3)
Creatinine, Ser: 0.74 mg/dL (ref 0.44–1.00)
GFR calc non Af Amer: >60 mL/min (ref >60)
GLUCOSE: 127 mg/dL — AB (ref 65–99)
POTASSIUM: 4 mmol/L (ref 3.5–5.1)
SODIUM: 137 mmol/L (ref 135–145)
Total Bilirubin: 1.2 mg/dL (ref 0.3–1.2)
Total Protein: 7.7 g/dL (ref 6.5–8.1)

## 2017-04-09 LAB — CBC WITH DIFFERENTIAL/PLATELET
BASOS PCT: 0 %
Basophils Absolute: 0 10*3/uL (ref 0.0–0.1)
EOS ABS: 0.1 10*3/uL (ref 0.0–0.7)
EOS PCT: 1 %
HCT: 41.6 % (ref 36.0–46.0)
Hemoglobin: 13.4 g/dL (ref 12.0–15.0)
LYMPHS ABS: 1.3 10*3/uL (ref 0.7–4.0)
Lymphocytes Relative: 24 %
MCH: 29.9 pg (ref 26.0–34.0)
MCHC: 32.2 g/dL (ref 30.0–36.0)
MCV: 92.9 fL (ref 78.0–100.0)
MONOS PCT: 11 %
Monocytes Absolute: 0.6 10*3/uL (ref 0.1–1.0)
NEUTROS PCT: 64 %
Neutro Abs: 3.5 10*3/uL (ref 1.7–7.7)
PLATELETS: 245 10*3/uL (ref 150–400)
RBC: 4.48 MIL/uL (ref 3.87–5.11)
RDW: 12.8 % (ref 11.5–15.5)
WBC: 5.5 10*3/uL (ref 4.0–10.5)

## 2017-04-09 LAB — LIPASE, BLOOD: Lipase: 28 U/L (ref 11–51)

## 2017-04-09 LAB — TROPONIN I: Troponin I: <0.03 ng/mL (ref <0.03)

## 2017-04-09 MED ORDER — GI COCKTAIL ~~LOC~~
30.0000 mL | Freq: Once | ORAL | Status: AC
Start: 2017-04-09 — End: 2017-04-09
  Administered 2017-04-09: 30 mL via ORAL
  Filled 2017-04-09: qty 30

## 2017-04-09 MED ORDER — AZITHROMYCIN 250 MG PO TABS: 250.0000 mg | ORAL_TABLET | Freq: Every day | ORAL | 0 refills | Status: DC

## 2017-04-09 MED ORDER — SODIUM CHLORIDE 0.9 % IV SOLN
1.0000 g | Freq: Once | INTRAVENOUS | Status: AC
  Administered 2017-04-09: 1 g via INTRAVENOUS
  Filled 2017-04-09: qty 10

## 2017-04-09 MED ORDER — SODIUM CHLORIDE 0.9 % IV SOLN
500.0000 mg | Freq: Once | INTRAVENOUS | Status: AC
  Administered 2017-04-09: 500 mg via INTRAVENOUS
  Filled 2017-04-09: qty 500

## 2017-04-09 NOTE — ED Provider Notes (Signed)
[facility] EMERGENCY DEPARTMENT Provider Note   CSN: [id number] Arrival date & time: 04/09/17  0846     History   Chief Complaint Chief Complaint  Patient presents with  . Chest Pain    HPI Morgan Hill is a 72 y.o. female.  HPI Patient presents with episodic chest pain this been ongoing for the last few days.  States the pain worsened last night while she was sitting down.  She describes the pain as central chest tightness and burning sensation.  She states that the tightness has now relieved and she only has burning sensation.  She has had some mild dyspnea with exertion.  No cough, fever or chills.  No new lower extremity swelling or pain.  Denies frequent NSAID use.  States she is taking Prilosec daily. Past Medical History:  Diagnosis Date  . Back pain   . CTS (carpal tunnel syndrome)    right  . GERD (gastroesophageal reflux disease)     Patient Active Problem List   Diagnosis Date Noted  . Depression 07/14/2014  . Prediabetes 11/02/2013  . GERD (gastroesophageal reflux disease) 05/03/2013  . Elevated TSH 05/03/2013  . Chest pain 05/02/2013  . Obesity 05/02/2013    History reviewed. No pertinent surgical history.  OB History    No data available       Home Medications    Prior to Admission medications   Medication Sig Start Date End Date Taking? Authorizing Provider  citalopram (CELEXA) 10 MG tablet Take 1 tablet (10 mg total) by mouth daily. 01/22/17  Yes Merlyn AlbertLuking, William S, MD  fluticasone Aleda Grana(FLONASE) 50 MCG/ACT nasal spray  05/31/13  Yes [provider]  omeprazole (PRILOSEC) 20 MG capsule TAKE 1 CAPSULE(20 MG) BY MOUTH DAILY 01/22/17  Yes Merlyn AlbertLuking, William S, MD  azithromycin (ZITHROMAX) 250 MG tablet Take 1 tablet (250 mg total) by mouth daily. Take first 2 tablets together, then 1 every day until finished. 04/10/17   Loren RacerYelverton, Cybill Uriegas, MD    Family History Family History  Problem Relation Age of Onset  . Hypertension Mother   . Diabetes  Mother   . Heart disease Mother     Social History Social History   Tobacco Use  . Smoking status: Former Games developermoker  . Smokeless tobacco: Former NeurosurgeonUser    Quit date: 01/11/1996  Substance Use Topics  . Alcohol use: Yes    Frequency: Never    Comment: occasional  . Drug use: No     Allergies   Patient has no known allergies.   Review of Systems Review of Systems  Constitutional: Negative for chills and fever.  HENT: Negative for sore throat and trouble swallowing.   Respiratory: Positive for chest tightness and shortness of breath. Negative for cough.   Cardiovascular: Positive for chest pain. Negative for palpitations and leg swelling.  Gastrointestinal: Negative for abdominal pain, constipation, diarrhea, nausea and vomiting.  Musculoskeletal: Negative for back pain, myalgias, neck pain and neck stiffness.  Skin: Negative for rash and wound.  Neurological: Negative for dizziness, syncope, weakness, light-headedness, numbness and headaches.  All other systems reviewed and are negative.    Physical Exam Updated Vital Signs BP (!) 145/60   Pulse (!) 55   Temp 98 F (36.7 C) (Oral)   Resp 13   Ht 5\' 3"  (1.6 m)   Wt 86.6 kg (191 lb)   SpO2 99%   BMI 33.83 kg/m   Physical Exam  Constitutional: She is oriented to person, place, and time. She  appears well-developed and well-nourished. No distress.  HENT:  Head: Normocephalic and atraumatic.  Mouth/Throat: Oropharynx is clear and moist. No oropharyngeal exudate.  Eyes: EOM are normal. Pupils are equal, round, and reactive to light.  Neck: Normal range of motion. Neck supple.  Cardiovascular: Normal rate and regular rhythm. Exam reveals no gallop and no friction rub.  No murmur heard. Pulmonary/Chest: Effort normal and breath sounds normal. No stridor. No respiratory distress. She has no wheezes. She has no rales. She exhibits no tenderness.  Abdominal: Soft. Bowel sounds are normal. There is no tenderness. There is no  rebound and no guarding.  Musculoskeletal: Normal range of motion. She exhibits no edema or tenderness.  No midline thoracic or lumbar tenderness.  No CVA tenderness.  No lower extremity swelling, asymmetry or tenderness.  Lymphadenopathy:    She has no cervical adenopathy.  Neurological: She is alert and oriented to person, place, and time.  Skin: Skin is warm and dry. Capillary refill takes less than 2 seconds. No rash noted. She is not diaphoretic. No erythema.  Psychiatric: She has a normal mood and affect. Her behavior is normal.  Nursing note and vitals reviewed.    ED Treatments / Results  Labs (all labs ordered are listed, but only abnormal results are displayed) Labs Reviewed  COMPREHENSIVE METABOLIC PANEL - Abnormal; Notable for the following components:      Result Value   Glucose, Bld 127 (*)    All other components within normal limits  CBC WITH DIFFERENTIAL/PLATELET  TROPONIN I  LIPASE, BLOOD  TROPONIN I    EKG  EKG Interpretation None       Radiology Dg Chest 2 View  Result Date: 04/09/2017 CLINICAL DATA:  Chest pain EXAM: CHEST  2 VIEW COMPARISON:  05/02/2013 FINDINGS: Patchy airspace disease in the lingula may represent atelectasis or pneumonia. No effusion. Lung volume normal. Negative for heart failure. IMPRESSION: Patchy linear airspace disease in the lingula may represent atelectasis or pneumonia Electronically Signed   By: Marlan Palau M.D.   On: 04/09/2017 10:28    Procedures Procedures (including critical care time)  Medications Ordered in ED Medications  azithromycin (ZITHROMAX) 500 mg in sodium chloride 0.9 % 250 mL IVPB (500 mg Intravenous New Bag/Given 04/09/17 1231)  gi cocktail (Maalox,Lidocaine,Donnatal) (30 mLs Oral Given 04/09/17 1007)  cefTRIAXone (ROCEPHIN) 1 g in sodium chloride 0.9 % 100 mL IVPB (0 g Intravenous Stopped 04/09/17 1228)     Initial Impression / Assessment and Plan / ED Course  I have reviewed the triage vital signs  and the nursing notes.  Pertinent labs & imaging results that were available during my care of the patient were reviewed by me and considered in my medical decision making (see chart for details).      Patient with some improvement in burning sensation in her chest after GI cocktail.  Troponin x2 is normal.  X-ray with questionable infiltrate in the left lingula.  Question concern for pneumonia.  Started on antibiotics in the emergency department.  She is advised to follow-up with her primary physician and to follow-up with cardiology as an outpatient.  Return precautions given. Final Clinical Impressions(s) / ED Diagnoses   Final diagnoses:  Atypical chest pain  Community acquired pneumonia, unspecified laterality    ED Discharge Orders        Ordered    azithromycin (ZITHROMAX) 250 MG tablet  Daily     04/09/17 1318       Loren Racer, MD 04/09/17  1319  

## 2017-04-09 NOTE — Telephone Encounter (Signed)
Patient called with c/o not feeling right-dizzy and burning in chest. Advised patient to go to the ER for evaluation and treatment. Patient verbalized understanding.

## 2017-04-09 NOTE — ED Triage Notes (Signed)
Pt reports having some chest tightness going down left arm yesterday.  Intermittent in nature but becoming more constant this morning.  Called pcp who suggested come here.

## 2017-04-12 ENCOUNTER — Ambulatory Visit (INDEPENDENT_AMBULATORY_CARE_PROVIDER_SITE_OTHER): Payer: Medicare HMO | Admitting: Family Medicine

## 2017-04-12 ENCOUNTER — Encounter: Payer: Self-pay | Admitting: Family Medicine

## 2017-04-12 VITALS — BP 144/78 | Ht 64.0 in | Wt 193.0 lb

## 2017-04-12 DIAGNOSIS — J189 Pneumonia, unspecified organism: Secondary | ICD-10-CM

## 2017-04-12 DIAGNOSIS — R0789 Other chest pain: Secondary | ICD-10-CM | POA: Diagnosis not present

## 2017-04-12 NOTE — Progress Notes (Signed)
   Subjective:    Patient ID: Morgan Hill, female    DOB: 06/02/1945, 72 y.o.   MRN: [id number]  HPI Patient is here today for hospital follow up.Pt was seen at Ed for atypical chest pain on 04/09/2017. Per pt she was told she has pneumonia.  Burning sensation in the chest Went away this weekend   Came back today  No assoc with cough   Quit in 97as far as smokeing smoked for thirty yrs  Pt now retired as of Water quality scientistjan, joined the y at two weeks ago, no discomfort with exerti Hx of shouldre injury with exertion  On dily omeprazole for reflux and herartburn  Complete hospital record reviewed in presence of patient old records reviewed.  X-rays and EKGs with  Review of Systems No headache, no major weight loss or weight gain, no chest pain no back pain abdominal pain no change in bowel habits complete ROS otherwise negative     Objective:   Physical Exam Alert and oriented, vitals reviewed and stable, NAD ENT-TM's and ext canals WNL bilat via otoscopic exam Soft palate, tonsils and post pharynx WNL via oropharyngeal exam Neck-symmetric, no masses; thyroid nonpalpable and nontender Pulmonary-no tachypnea or accessory muscle use; Clear without wheezes via auscultation Card--no abnrml murmurs, rhythm reg and rate WNL Carotid pulses symmetric, without bruits No chest wall pain to palpation       Assessment & Plan:  Impression atypical chest pain very long discussion L.  Did a negative workup 3 years ago if this felt to be musculoskeletal with an element of GI.  Patient also had questionable atelectasis versus infiltrate on x-ray.  30-year history of smoking.  Discussed doubt that pain is cardiac no I will honor and agree with the ER doctor's recommendation for cardiology consultation. Need another stress test.  In addition follow-up x-ray in several weeks but not complete resolution will need to scan this area discussed with patient  Greater than 50% of this 25 minute face to face  visit was spent in counseling and discussion and coordination of care regarding the above diagnosis/diagnosies

## 2017-04-14 ENCOUNTER — Ambulatory Visit: Payer: Medicare HMO | Admitting: Cardiology

## 2017-04-14 ENCOUNTER — Encounter: Payer: Self-pay | Admitting: Cardiology

## 2017-04-14 DIAGNOSIS — R079 Chest pain, unspecified: Secondary | ICD-10-CM

## 2017-04-14 MED ORDER — PANTOPRAZOLE SODIUM 40 MG PO TBEC: 40.0000 mg | DELAYED_RELEASE_TABLET | Freq: Every day | ORAL | 11 refills | Status: DC

## 2017-04-14 NOTE — Progress Notes (Signed)
Clinical Summary Morgan Hill is a 72 y.o.female seen as new consult. I last saw her in the hospital in 04/2013. Referred by Dr Gerda DissLuking for chest pain.    1. Chest pain - 04/2013 stress echo no ischemia - seen in ER 03/2017 with chest pain - Trops neg x 2. ER notes report better with GI cocktail. CXR with ? Pneumonia, given course of abx.   - started on Sunday while at home in bed. Burning pain/pressure midchest throughout entire chest. No other associated symptoms. Not positional. Lasts up to 1 minute, would go and come back. Can radiate to arm.  - symptoms on and off since Sunday. - no relation to food. 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 Back pain   . CTS (carpal tunnel syndrome)    right  . GERD (gastroesophageal reflux disease)      No Known Allergies   Current Outpatient Medications  Medication Sig Dispense Refill  . azithromycin (ZITHROMAX) 250 MG tablet Take 1 tablet (250 mg total) by mouth daily. Take first 2 tablets together, then 1 every day until finished. 6 tablet 0  . citalopram (CELEXA) 10 MG tablet Take 1 tablet (10 mg total) by mouth daily. 90 tablet 1  . fluticasone (FLONASE) 50 MCG/ACT nasal spray     . omeprazole (PRILOSEC) 20 MG capsule TAKE 1 CAPSULE(20 MG) BY MOUTH DAILY 30 capsule 5   No current facility-administered medications for this visit.         No Known Allergies    Family History  Problem Relation Age of Onset  . Hypertension Mother   . Diabetes Mother   . Heart disease Mother      Social History Morgan Hill reports that she has quit smoking. She quit smokeless tobacco use about 21 years ago. Morgan Hill reports that she drinks alcohol.   Review of Systems CONSTITUTIONAL: No weight loss, fever, chills, weakness or fatigue.  HEENT: Eyes: No visual loss, blurred vision, double vision or yellow sclerae.No hearing loss, sneezing, congestion, runny nose or  sore throat.  SKIN: No rash or itching.  CARDIOVASCULAR: per hpi RESPIRATORY: per hpi GASTROINTESTINAL: No anorexia, nausea, vomiting or diarrhea. No abdominal pain or blood.  GENITOURINARY: No burning on urination, no polyuria NEUROLOGICAL: No headache, dizziness, syncope, paralysis, ataxia, numbness or tingling in the extremities. No change in bowel or bladder control.  MUSCULOSKELETAL: No muscle, back pain, joint pain or stiffness.  LYMPHATICS: No enlarged nodes. No history of splenectomy.  PSYCHIATRIC: No history of depression or anxiety.  ENDOCRINOLOGIC: No reports of sweating, cold or heat intolerance. No polyuria or polydipsia.  Marland Kitchen.   Physical Examination Vitals:   04/14/17 1000 04/14/17 1006  BP: 138/78 (!) 150/86  Pulse: 83   SpO2: 99%    Vitals:   04/14/17 1000  Weight: 192 lb (87.1 kg)  Height: 5\' 4"  (1.626 m)    Gen: resting comfortably, no acute distress HEENT: no scleral icterus, pupils equal round and reactive, no palptable cervical adenopathy,  CV: RRR, no m/r/g, no jvd Resp: Clear to auscultation bilaterally GI: abdomen is soft, non-tender, non-distended, normal bowel sounds, no hepatosplenomegaly MSK: extremities are warm, no edema.  Skin: warm, no rash Neuro:  no focal deficits Psych: appropriate affect   Diagnostic Studies 04/2013 stress echo Study Conclusions  Staged echo: Resting left ventricular systolic function was normal, EF 55-60%. With exercise, there was normal  augmentation of all wall segments, with hyperdynamic contraction seen. Left ventricular ejection fraction was normal at rest and with stress. Normal echo stress  Impressions:  - Normal study after maximal exercise.      Assessment and Plan  1. Chest pain - we will plan for an exericse nuclear stress test to further evaluate - stop omeprazole, start protonix 40mg  daily in case possible GI etiology   F/u pending stress results      Antoine Poche, M.D.

## 2017-04-14 NOTE — Patient Instructions (Signed)
Medication Instructions:  Stop omeprazole  Start protonix 40 mg daily   Labwork: none  Testing/Procedures: Your physician has requested that you have en exercise stress myoview. For further information please visit [email or web address]. Please follow instruction sheet, as given.    Follow-Up: Your physician recommends that you schedule a follow-up appointment in: pending following stress test results    Any Other Special Instructions Will Be Listed Below (If Applicable).     If you need a refill on your cardiac medications before your next appointment, please call your pharmacy.

## 2017-04-15 ENCOUNTER — Encounter: Payer: Self-pay | Admitting: Cardiology

## 2017-04-19 ENCOUNTER — Encounter (HOSPITAL_BASED_OUTPATIENT_CLINIC_OR_DEPARTMENT_OTHER)
Admission: RE | Admit: 2017-04-19 | Discharge: 2017-04-19 | Disposition: A | Discharge status: Home / Self Care | Payer: Medicare HMO | Source: Ambulatory Visit | Attending: Cardiology | Admitting: Cardiology

## 2017-04-19 ENCOUNTER — Encounter (HOSPITAL_COMMUNITY)
Admission: RE | Admit: 2017-04-19 | Discharge: 2017-04-19 | Disposition: A | Discharge status: Home / Self Care | Payer: Medicare HMO | Source: Ambulatory Visit | Attending: Cardiology | Admitting: Cardiology

## 2017-04-19 ENCOUNTER — Encounter (HOSPITAL_COMMUNITY): Payer: Self-pay

## 2017-04-19 DIAGNOSIS — R079 Chest pain, unspecified: Secondary | ICD-10-CM | POA: Diagnosis not present

## 2017-04-19 LAB — NM MYOCAR MULTI W/SPECT W/WALL MOTION / EF
CHL CUP MPHR: 150 {beats}/min
CHL CUP NUCLEAR SDS: 6
CHL CUP NUCLEAR SSS: 7
CHL CUP RESTING HR STRESS: 65 {beats}/min
CSEPED: 7 min
CSEPHR: 88 %
CSEPPHR: 133 {beats}/min
Estimated workload: 8.2 METS
Exercise duration (sec): 20 s
LHR: 0.45
LV sys vol: 17 mL
LVDIAVOL: 57 mL (ref 46–106)
RPE: 13
SRS: 1
TID: 1.03

## 2017-04-19 MED ORDER — REGADENOSON 0.4 MG/5ML IV SOLN
INTRAVENOUS | Status: AC
  Filled 2017-04-19: qty 5

## 2017-04-19 MED ORDER — TECHNETIUM TC 99M TETROFOSMIN IV KIT
10.0000 | PACK | Freq: Once | INTRAVENOUS | Status: AC | PRN
  Administered 2017-04-19: 10.5 via INTRAVENOUS

## 2017-04-19 MED ORDER — TECHNETIUM TC 99M TETROFOSMIN IV KIT
30.0000 | PACK | Freq: Once | INTRAVENOUS | Status: AC | PRN
  Administered 2017-04-19: 31 via INTRAVENOUS

## 2017-04-19 MED ORDER — SODIUM CHLORIDE 0.9% FLUSH
INTRAVENOUS | Status: AC
  Administered 2017-04-19: 10 mL via INTRAVENOUS
  Filled 2017-04-19: qty 10

## 2017-05-03 ENCOUNTER — Ambulatory Visit (HOSPITAL_COMMUNITY)
Admission: RE | Admit: 2017-05-03 | Discharge: 2017-05-03 | Disposition: A | Discharge status: Home / Self Care | Payer: Medicare HMO | Source: Ambulatory Visit | Attending: Family Medicine | Admitting: Family Medicine

## 2017-05-03 DIAGNOSIS — J189 Pneumonia, unspecified organism: Secondary | ICD-10-CM | POA: Insufficient documentation

## 2017-05-05 ENCOUNTER — Ambulatory Visit (INDEPENDENT_AMBULATORY_CARE_PROVIDER_SITE_OTHER): Payer: Medicare HMO | Admitting: Family Medicine

## 2017-05-05 ENCOUNTER — Encounter: Payer: Self-pay | Admitting: Family Medicine

## 2017-05-05 VITALS — BP 134/70 | Temp 98.9°F | Ht 64.0 in | Wt 191.0 lb

## 2017-05-05 DIAGNOSIS — R9389 Abnormal findings on diagnostic imaging of other specified body structures: Secondary | ICD-10-CM

## 2017-05-05 NOTE — Progress Notes (Signed)
   Subjective:    Patient ID: Morgan Hill, female    DOB: 04/22/1945, 72 y.o.   MRN: [id number] Patient arrives office for a very protracted discussion regarding her situation HPIpt wanted to discuss results of chest xray.  We called patient also x-ray.  She wanted more substantial discussion.  Had pneumonia like illness a month ago.  X-ray revealed left lingular scarring versus infiltrate.  X-ray repeat shows ongoing findings in same area.  Patient smoked 1 pack/day for greater than 30 years  We will chest x-ray did also reveal an element of lingular scarring  Has since seen a cardiologist for negative stress test  , Weeks a slight scratchy throat and allergy symptoms Scratchy throat for almost 2 weeks. Feeling a little pressure and dranage using flonase and throat lozenges     Quit smoking in 98 started 65 .  Review of Systems No headache, no major weight loss or weight gain, no chest pain no back pain abdominal pain no change in bowel habits complete ROS otherwise negative     Objective:   Physical Exam  Alert and oriented, vitals reviewed and stable, NAD ENT-TM's and ext canals WNL bilat via otoscopic exam Soft palate, tonsils and post pharynx WNL via oropharyngeal exam Neck-symmetric, no masses; thyroid nonpalpable and nontender Pulmonary-no tachypnea or accessory muscle use; Clear without wheezes via auscultation Card--no abnrml murmurs, rhythm reg and rate WNL Carotid pulses symmetric, without bruits   Impression persistent chest x-ray abnormality and greater than 30-pack-year smoker recent cough just      Assessment & Plan:  Long discussion L.  CTR only way to assess for potential of more serious etiology.  Pros and cons discussed.  Patient wishes to proceed  2.  Allergic rhinitis discussed.  Discussed patient to maintain Flonase as needed Claritin versus  Greater than 50% of this 25 minute face to face visit was spent in counseling and discussion and  coordination of care regarding the above diagnosis/diagnosies

## 2017-05-20 ENCOUNTER — Ambulatory Visit (HOSPITAL_COMMUNITY)
Admission: RE | Admit: 2017-05-20 | Discharge: 2017-05-20 | Disposition: A | Discharge status: Home / Self Care | Payer: Medicare HMO | Source: Ambulatory Visit | Attending: Family Medicine | Admitting: Family Medicine

## 2017-05-20 DIAGNOSIS — R9389 Abnormal findings on diagnostic imaging of other specified body structures: Secondary | ICD-10-CM | POA: Diagnosis not present

## 2017-05-20 DIAGNOSIS — R918 Other nonspecific abnormal finding of lung field: Secondary | ICD-10-CM | POA: Diagnosis not present

## 2017-07-07 ENCOUNTER — Encounter: Payer: Self-pay | Admitting: Family Medicine

## 2017-07-07 ENCOUNTER — Ambulatory Visit (INDEPENDENT_AMBULATORY_CARE_PROVIDER_SITE_OTHER): Payer: Medicare HMO | Admitting: Family Medicine

## 2017-07-07 VITALS — BP 140/80 | Ht 64.0 in | Wt 196.0 lb

## 2017-07-07 DIAGNOSIS — K219 Gastro-esophageal reflux disease without esophagitis: Secondary | ICD-10-CM | POA: Diagnosis not present

## 2017-07-07 DIAGNOSIS — R911 Solitary pulmonary nodule: Secondary | ICD-10-CM

## 2017-07-07 DIAGNOSIS — F321 Major depressive disorder, single episode, moderate: Secondary | ICD-10-CM | POA: Diagnosis not present

## 2017-07-07 DIAGNOSIS — R69 Illness, unspecified: Secondary | ICD-10-CM | POA: Diagnosis not present

## 2017-07-07 DIAGNOSIS — R7303 Prediabetes: Secondary | ICD-10-CM

## 2017-07-07 LAB — POCT GLYCOSYLATED HEMOGLOBIN (HGB A1C): HEMOGLOBIN A1C: 5.4

## 2017-07-07 NOTE — Progress Notes (Signed)
   Subjective:    Patient ID: Morgan Hill, female    DOB: October 02, 1945, 72 y.o.   MRN: [id number]  HPI  Patient is here today to discuss her chronic health issues   Pos hx of prediabetes  Patient notes ongoing compliance with antidepressant medication. No obvious side effects. Reports does not miss a dose. Overall continues to help depression substantially. No thoughts of homicide or suicide. Would like to maintain medication.  occas misses a dose  Sometimes pt will hold off on th med for awhile, and not taking, Not inclined to use unless she really needs it.   Exercising very regulaly , and handlin well  Now doing much better with exercise tolerance  Patient has known prediabetes.  States would like to get A1c checked every 6 months.  Trying to watch her sugar intake.  Exercising fairly regularly.  CT scan of chest was performed.  This was follow-up of pneumonia.  Fortunately it revealed the pneumonia had disappeared and there is no underlying mass with unfortunately did reveal a pulmonary nodule that was evident patient has a 30-pack-year history of smoking.  Quit nearly 20 years ago.  Radiologist recommended a follow-up scan in 1 year if high risk to maintain follow-up with this depression depression testing testing 123   along with her results from her ct scan.She filled out phq 9 today at visit.  Review of Systems Results for orders placed or performed in visit on 07/07/17  POCT glycosylated hemoglobin (Hb A1C)  Result Value Ref Range   Hemoglobin A1C 5.4        Objective:   Physical Exam Alert and oriented, vitals reviewed and stable, NAD ENT-TM's and ext canals WNL bilat via otoscopic exam Soft palate, tonsils and post pharynx WNL via oropharyngeal exam Neck-symmetric, no masses; thyroid nonpalpable and nontender Pulmonary-no tachypnea or accessory muscle use; Clear without wheezes via auscultation Card--no abnrml murmurs, rhythm reg and rate WNL Carotid pulses  symmetric, without bruits        Assessment & Plan:  1depression discussed at length.  Patient uses her serotonin  on a somewhat intermittent basis.  As opposed to standard recommendations discussed  2.  p prediabetes discussed at great length.  Diet discussed weight loss discussed.  A1c excellent at this time  3.  Pulmonary nodule evident on scan.  Patient smoking history we should follow-up with a repeat scan at the 55-month interval was recommended long discussion held  P  Greater than 50% of this 25 minute face to face visit was spent in counseling and discussion and coordination of care regarding the above diagnosis/diagnosies

## 2017-07-20 DIAGNOSIS — Z0100 Encounter for examination of eyes and vision without abnormal findings: Secondary | ICD-10-CM | POA: Diagnosis not present

## 2017-07-20 DIAGNOSIS — H5200 Hypermetropia, unspecified eye: Secondary | ICD-10-CM | POA: Diagnosis not present

## 2017-09-15 ENCOUNTER — Other Ambulatory Visit: Payer: Self-pay | Admitting: Family Medicine

## 2018-01-18 ENCOUNTER — Encounter: Payer: Self-pay | Admitting: Family Medicine

## 2018-01-18 ENCOUNTER — Ambulatory Visit (INDEPENDENT_AMBULATORY_CARE_PROVIDER_SITE_OTHER): Payer: Medicare HMO | Admitting: Family Medicine

## 2018-01-18 VITALS — BP 124/76 | Ht 64.0 in | Wt 197.1 lb

## 2018-01-18 DIAGNOSIS — R69 Illness, unspecified: Secondary | ICD-10-CM | POA: Diagnosis not present

## 2018-01-18 DIAGNOSIS — Z13220 Encounter for screening for lipoid disorders: Secondary | ICD-10-CM | POA: Diagnosis not present

## 2018-01-18 DIAGNOSIS — R7303 Prediabetes: Secondary | ICD-10-CM

## 2018-01-18 DIAGNOSIS — Z23 Encounter for immunization: Secondary | ICD-10-CM

## 2018-01-18 DIAGNOSIS — Z79899 Other long term (current) drug therapy: Secondary | ICD-10-CM | POA: Diagnosis not present

## 2018-01-18 DIAGNOSIS — F321 Major depressive disorder, single episode, moderate: Secondary | ICD-10-CM | POA: Diagnosis not present

## 2018-01-18 DIAGNOSIS — K219 Gastro-esophageal reflux disease without esophagitis: Secondary | ICD-10-CM | POA: Diagnosis not present

## 2018-01-18 MED ORDER — CITALOPRAM HYDROBROMIDE 10 MG PO TABS: 10.0000 mg | ORAL_TABLET | Freq: Every day | ORAL | 1 refills | Status: DC

## 2018-01-18 NOTE — Progress Notes (Signed)
   Subjective:    Patient ID: Morgan Morgan, female    DOB: 06/05/1945, 72 y.o.   MRN: [id number] Patient arrives with numerous concerns HPI  Patient is here today to follow up on her chronic illnesses. She has Genella RifeGerd and is taking Protonix 40 mg daily. She has a history of depression and is taking Celexa 10 mg per day. She is filling out a phq 9.  Pos htn in the family  Mo had high blood pesure    Ex[facility]  [facility]   Patient notes ongoing compliance with antidepressant medication. No obvious side effects. Reports does not miss a dose. Overall continues to help depression substantially. No thoughts of homicide or suicide. Would like to maintain medication.   Prediabetes.  Known history of this.  Trying to work on sugar.  Curious about her A1c.  reflux, substantial per patient.  With ongoing need for meds.   Concerned about her legs.  Right more than left.  Swelling at times when standing for long period of time.  Veins are poking out.  She becomes very fatigued.  Review of Systems No headache, no major weight loss or weight gain, no chest pain no back pain abdominal pain no change in bowel habits complete ROS otherwise negative     Objective:   Physical Exam   Alert and oriented, vitals reviewed and stable, NAD ENT-TM's and ext canals WNL bilat via otoscopic exam Soft palate, tonsils and post pharynx WNL via oropharyngeal exam Neck-symmetric, no masses; thyroid nonpalpable and nontender Pulmonary-no tachypnea or accessory muscle use; Clear without wheezes via auscultation Card--no abnrml murmurs, rhythm reg and rate WNL Carotid pulses symmetric, without bruits Bilateral legs venous stasis changes evident.  Positive varicosities     Assessment & Plan:  Impression 1 prediabetes.  Discussed.  A1c uncertain will check  2.  Depression.  Clinically stable to maintain same dose of meds  3.  Chronic reflux.  Ongoing to maintain meds  4.  Chronic  venous stasis.  Will need to initiate compression stockings.  Discussed and prescribed  Further recommendations based on blood work.  Flu shot discussed diet exercise discussed follow-up in 6 months

## 2018-01-21 DIAGNOSIS — Z13220 Encounter for screening for lipoid disorders: Secondary | ICD-10-CM | POA: Diagnosis not present

## 2018-01-21 DIAGNOSIS — R7303 Prediabetes: Secondary | ICD-10-CM | POA: Diagnosis not present

## 2018-01-21 DIAGNOSIS — Z79899 Other long term (current) drug therapy: Secondary | ICD-10-CM | POA: Diagnosis not present

## 2018-01-22 LAB — HEMOGLOBIN A1C
ESTIMATED AVERAGE GLUCOSE: 134 mg/dL
Hgb A1c MFr Bld: 6.3 % — ABNORMAL HIGH (ref 4.8–5.6)

## 2018-01-22 LAB — BASIC METABOLIC PANEL
BUN / CREAT RATIO: 13 (ref 12–28)
BUN: 11 mg/dL (ref 8–27)
CO2: 23 mmol/L (ref 20–29)
CREATININE: 0.82 mg/dL (ref 0.57–1.00)
Calcium: 9.4 mg/dL (ref 8.7–10.3)
Chloride: 103 mmol/L (ref 96–106)
GFR calc non Af Amer: 72 mL/min/{1.73_m2} (ref >59)
GFR, EST AFRICAN AMERICAN: 83 mL/min/{1.73_m2} (ref >59)
Glucose: 115 mg/dL — ABNORMAL HIGH (ref 65–99)
Potassium: 4.6 mmol/L (ref 3.5–5.2)
SODIUM: 140 mmol/L (ref 134–144)

## 2018-01-22 LAB — HEPATIC FUNCTION PANEL
ALT: 18 IU/L (ref 0–32)
AST: 19 IU/L (ref 0–40)
Albumin: 4.6 g/dL (ref 3.5–4.8)
Alkaline Phosphatase: 67 IU/L (ref 39–117)
BILIRUBIN TOTAL: 0.6 mg/dL (ref 0.0–1.2)
BILIRUBIN, DIRECT: 0.14 mg/dL (ref 0.00–0.40)
TOTAL PROTEIN: 7.5 g/dL (ref 6.0–8.5)

## 2018-01-22 LAB — LIPID PANEL
CHOL/HDL RATIO: 3.2 ratio (ref 0.0–4.4)
Cholesterol, Total: 207 mg/dL — ABNORMAL HIGH (ref 100–199)
HDL: 65 mg/dL (ref >39)
LDL CALC: 120 mg/dL — AB (ref 0–99)
TRIGLYCERIDES: 111 mg/dL (ref 0–149)
VLDL Cholesterol Cal: 22 mg/dL (ref 5–40)

## 2018-02-27 ENCOUNTER — Other Ambulatory Visit: Payer: Self-pay | Admitting: Cardiology

## 2018-02-28 DIAGNOSIS — R69 Illness, unspecified: Secondary | ICD-10-CM | POA: Diagnosis not present

## 2018-04-26 DIAGNOSIS — R69 Illness, unspecified: Secondary | ICD-10-CM | POA: Diagnosis not present

## 2018-08-11 ENCOUNTER — Encounter: Payer: Self-pay | Admitting: Gastroenterology

## 2018-08-30 ENCOUNTER — Other Ambulatory Visit: Payer: Self-pay | Admitting: Family Medicine

## 2018-09-02 NOTE — Telephone Encounter (Signed)
Pt is checking status of refill

## 2018-09-06 ENCOUNTER — Ambulatory Visit: Payer: Medicare HMO | Admitting: Family Medicine

## 2018-09-06 DIAGNOSIS — H2513 Age-related nuclear cataract, bilateral: Secondary | ICD-10-CM | POA: Diagnosis not present

## 2018-09-06 DIAGNOSIS — H1011 Acute atopic conjunctivitis, right eye: Secondary | ICD-10-CM | POA: Diagnosis not present

## 2018-09-13 DIAGNOSIS — H1011 Acute atopic conjunctivitis, right eye: Secondary | ICD-10-CM | POA: Diagnosis not present

## 2018-09-30 DIAGNOSIS — H1011 Acute atopic conjunctivitis, right eye: Secondary | ICD-10-CM | POA: Diagnosis not present

## 2018-10-04 IMAGING — DX DG CHEST 2V
2 series · 2 of 2 positions shown · non-contrast
Comparison: 04/09/17

CLINICAL DATA: Follow-up pneumonia

EXAM:
CHEST - 2 VIEW

[chest pa]
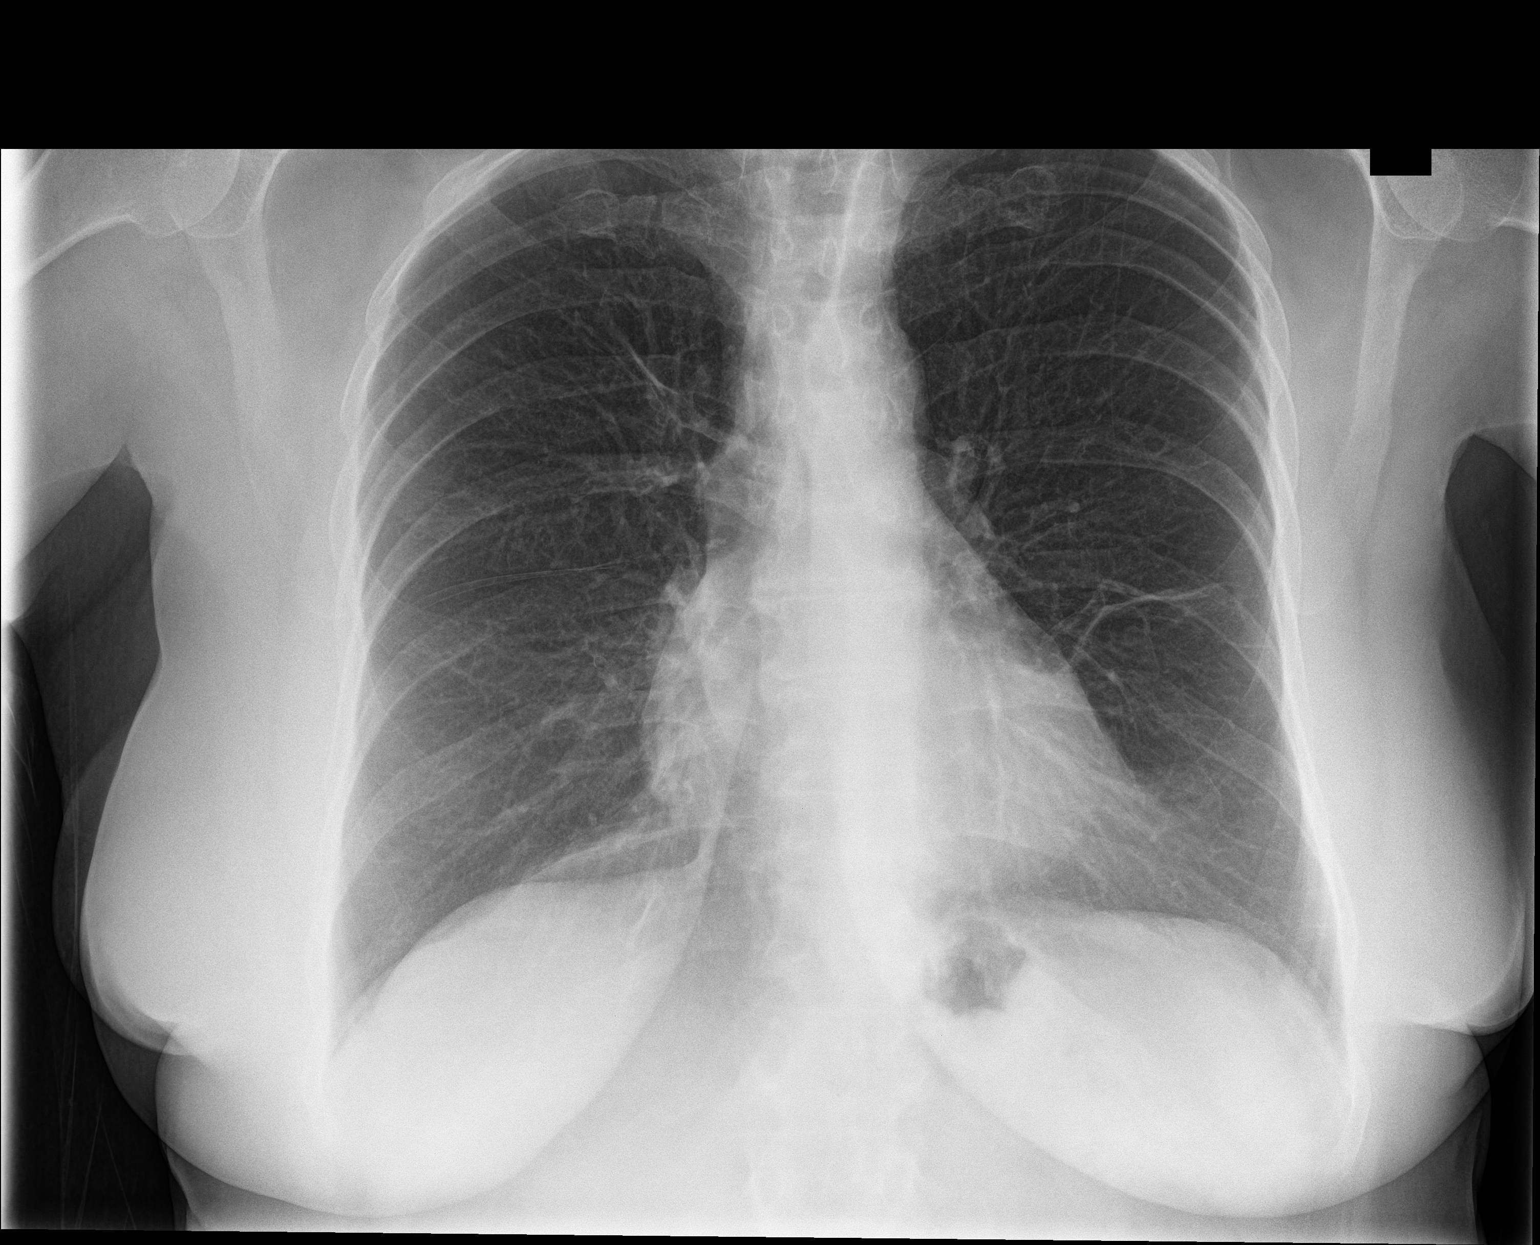

[chest lat]
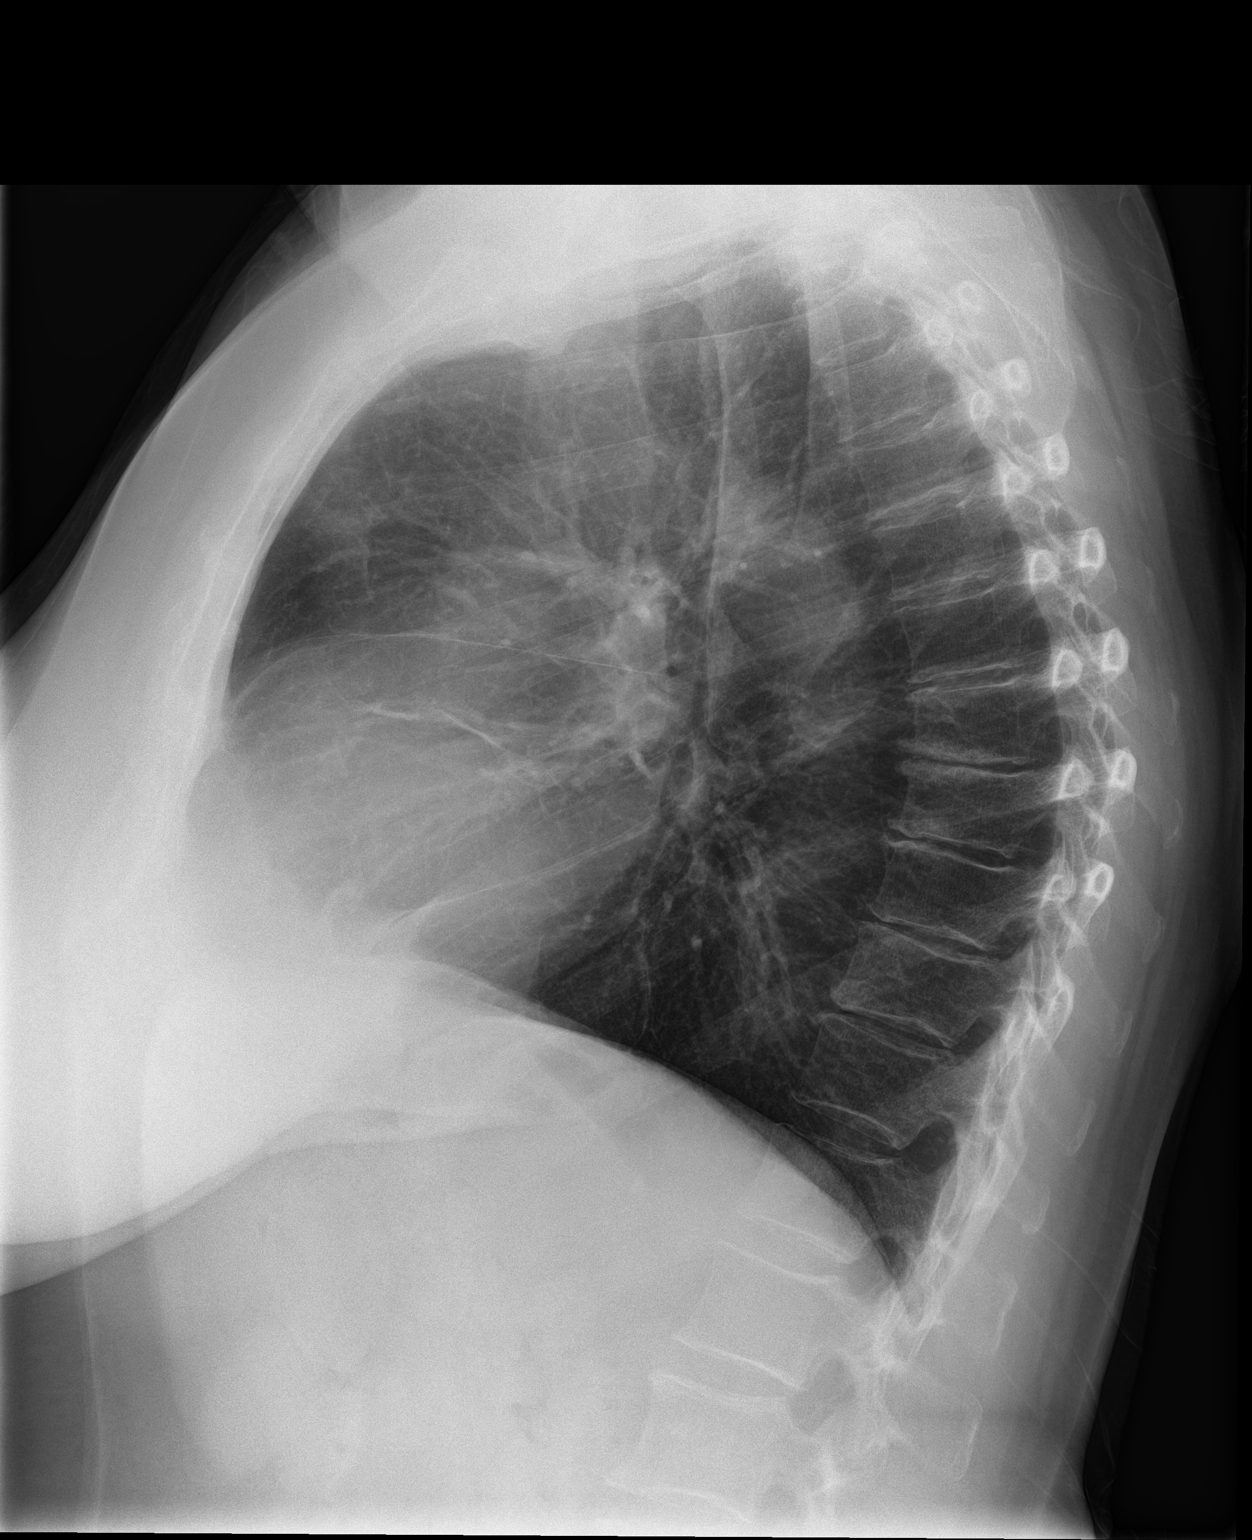

[2 of 2 positions shown; findings below may reference images not displayed]

FINDINGS: Cardiac shadow is stable. The lungs are well aerated bilaterally.
Linear scarring is again noted in the lingula stable from the prior
exam. No sizable effusion or infiltrate is noted. No bony
abnormality is seen.
IMPRESSION: Chronic scarring in the lingula.  No acute abnormality is noted.

## 2018-10-21 IMAGING — CT CT CHEST W/O CM
2 of 3 series · 15 of 36 positions shown, 18 images · non-contrast
Comparison: Chest radiographs 05/03/2017 and earlier.

CLINICAL DATA: 71-year-old female with pneumonia earlier this year.
Lingula scarring suspected on recent chest radiographs. Subsequent
encounter.

EXAM:
CT CHEST WITHOUT CONTRAST
TECHNIQUE: Multidetector CT imaging of the chest was performed following the
standard protocol without IV contrast.

[Series 2: thorax · axial · 0.58mm/px · z∈[+1454,+1692]mm · 12 of 141 slices shown, 15 images]
[im 11/141  mediastinal]
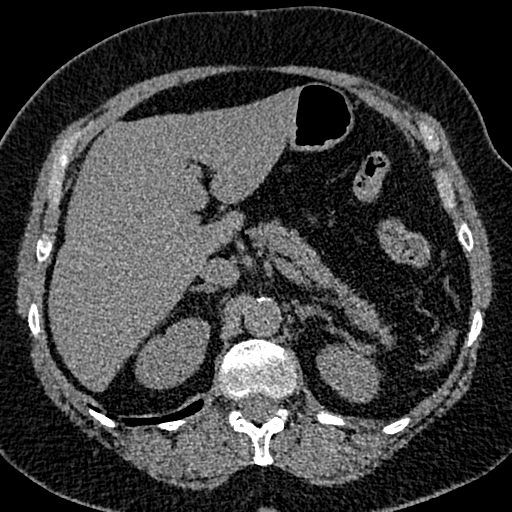
[im 11/141  lung]
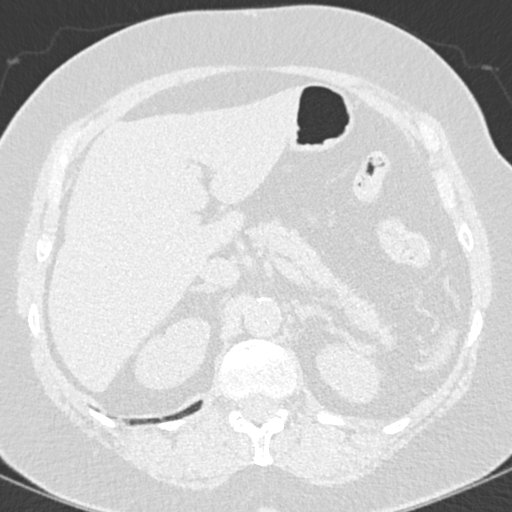
[im 21/141  lung]
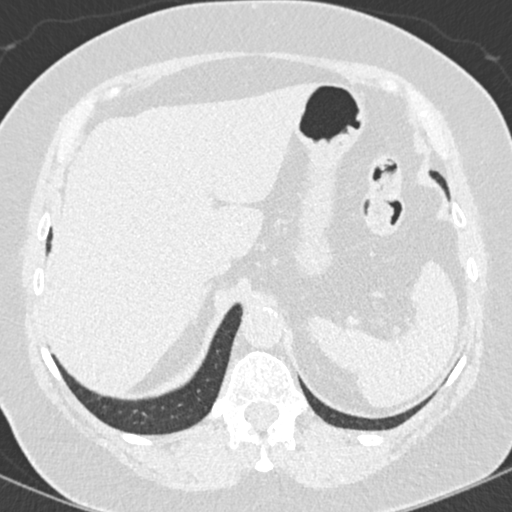
[im 32/141  lung]
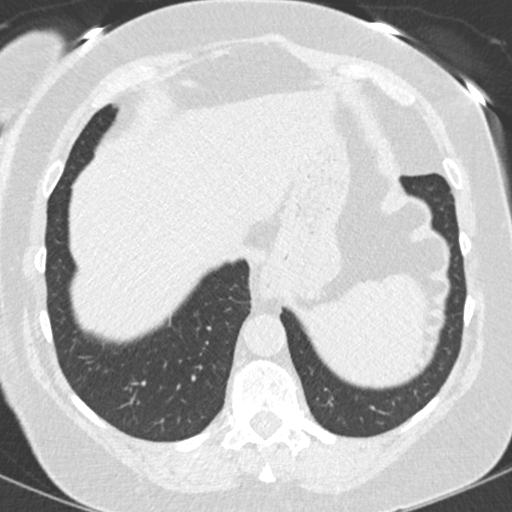
[im 42/141  lung]
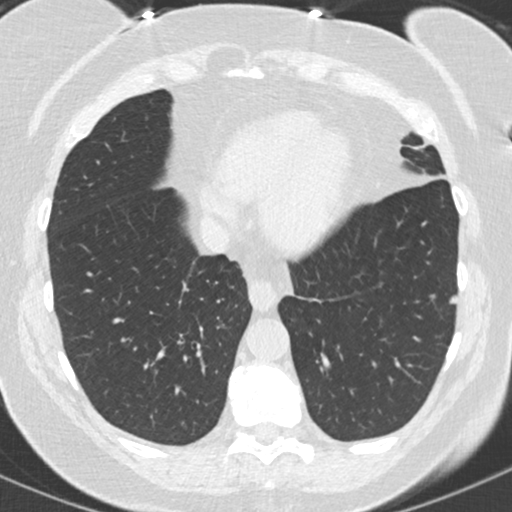
[im 52/141  mediastinal]
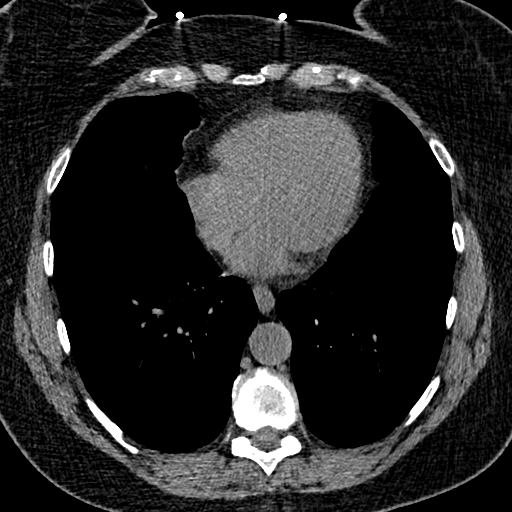
[im 52/141  lung]
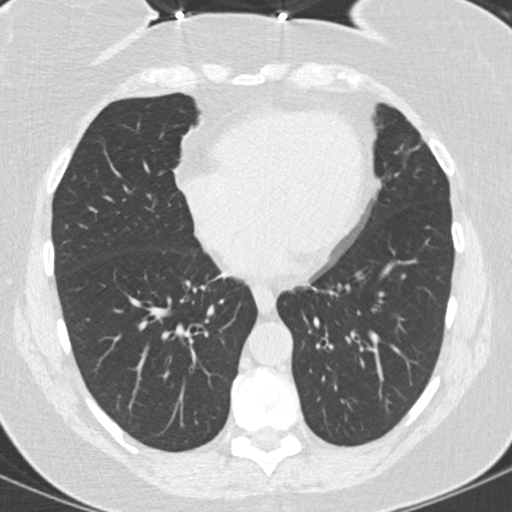
[im 63/141  lung]
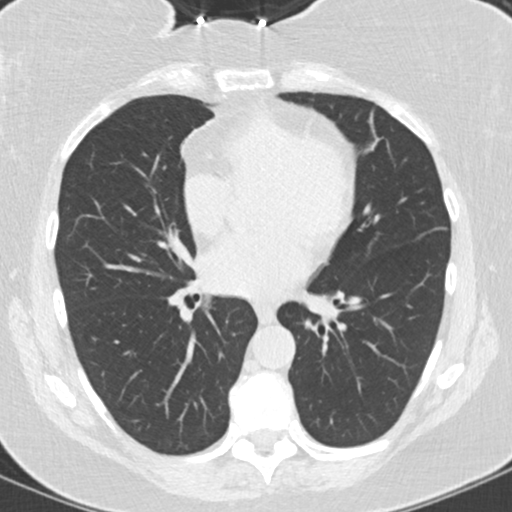
[im 78/141  lung]
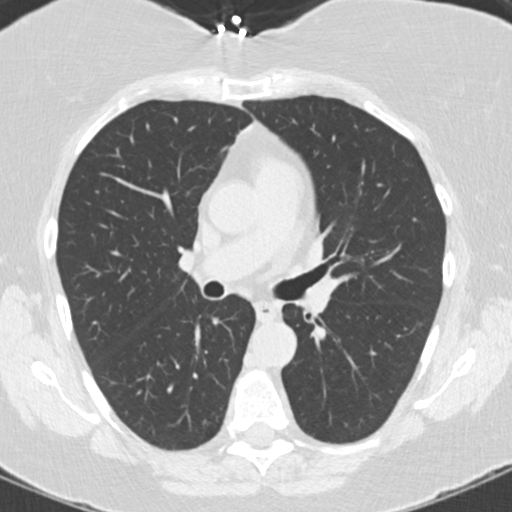
[im 89/141  lung]
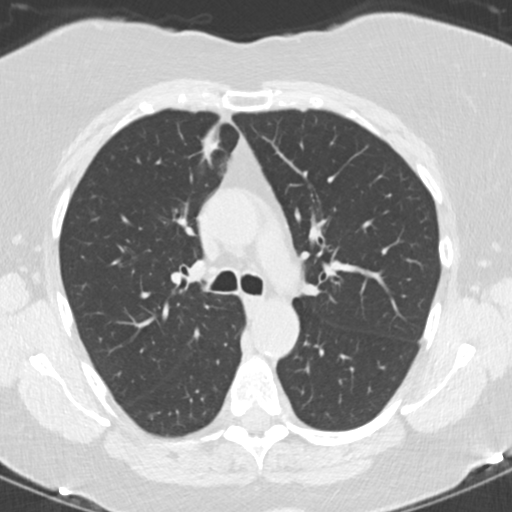
[im 99/141  mediastinal]
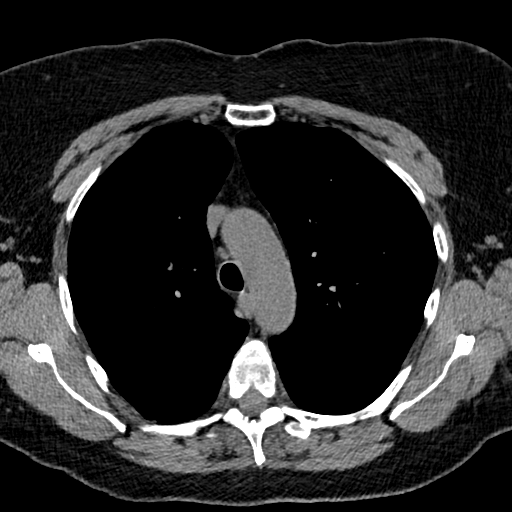
[im 99/141  lung]
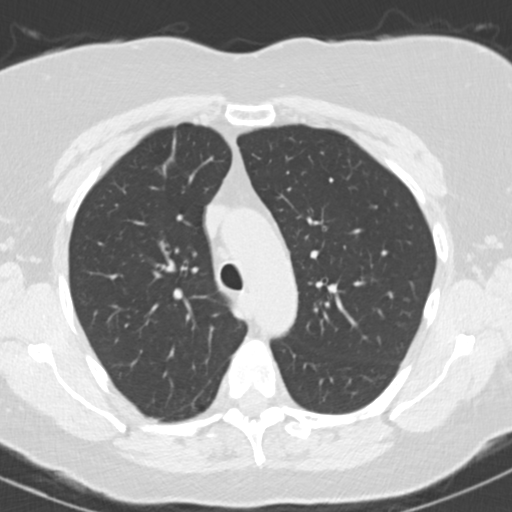
[im 109/141  lung]
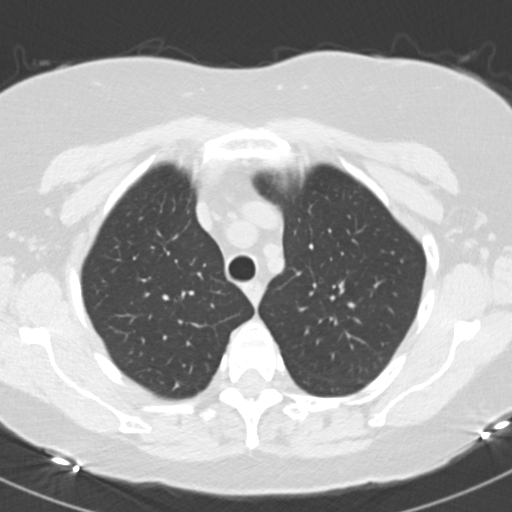
[im 120/141  lung]
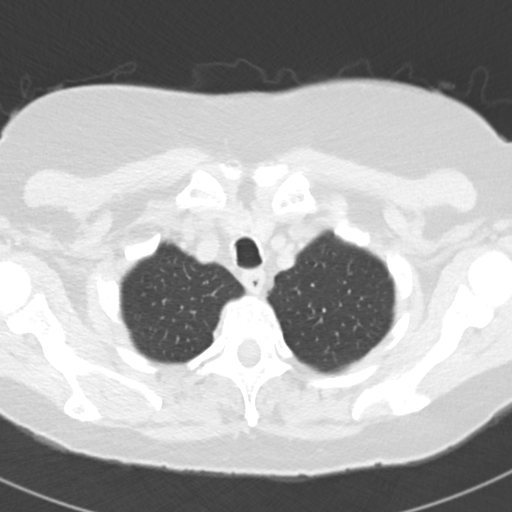
[im 130/141  lung]
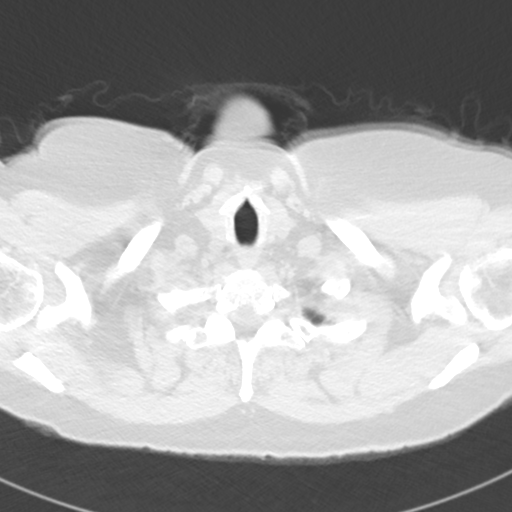

[Series 5: coronal · coronal · 0.59mm/px · 3 of 151 slices shown]
[im 31/151  lung]
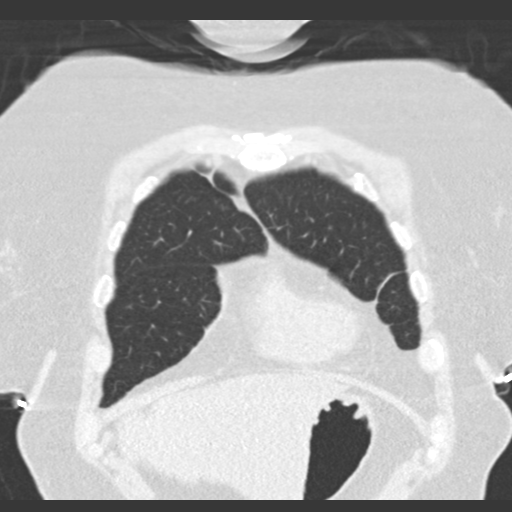
[im 61/151  lung]
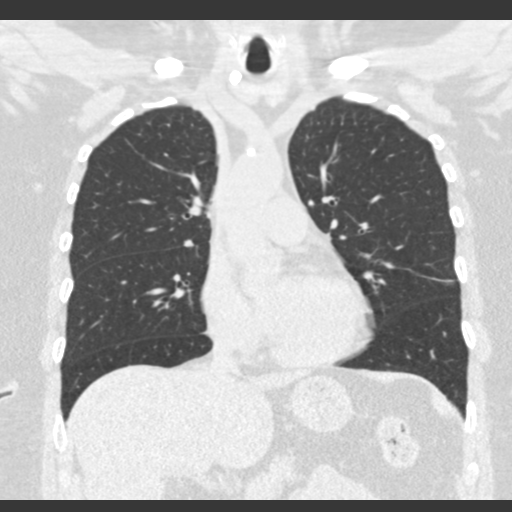
[im 91/151  lung]
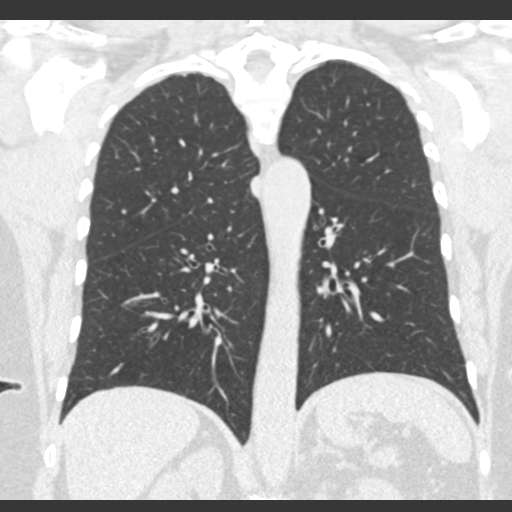

[15 of 36 positions shown; findings below may reference images not displayed]

FINDINGS: Cardiovascular: No cardiomegaly or pericardial effusion. Minimal
Calcified aortic atherosclerosis. Vascular patency is not evaluated
in the absence of IV contrast.

Mediastinum/Nodes: Negative.  No lymphadenopathy.

Lungs/Pleura: Major airways are patent. Somewhat large lung volumes
with increased AP dimension to the chest. Multifocal mild
curvilinear scarring in the lingula (series 4, image 96 and coronal
image 41. Similar mild scarring in the anterior right upper lobe
(same coronal image). There is a small subpleural 5 millimeter
pulmonary nodule in the lateral basal segment of the left lower lobe
on series 4, image 100 which appears likely postinflammatory and
benign. There is minimal linear scarring in the posterior right
costophrenic angle. No pleural effusion or other abnormal pulmonary
opacity.

Upper Abdomen: Negative visible noncontrast liver, spleen, pancreas,
adrenal glands, kidneys, and bowel in the upper abdomen.

Musculoskeletal: Mildly exaggerated thoracic kyphosis. Occasional
chronic thoracic disc and endplate degeneration. No acute osseous
abnormality identified.
IMPRESSION: 1. Pulmonary hyperinflation suspected. Scattered mild pulmonary
scarring including in the lingula. Negative for pneumonia.
2. Small likely postinflammatory 5 mm pulmonary nodule in the
lateral basal segment of the left lower lobe. No follow-up needed if
patient is low-risk. Non-contrast chest CT can be considered in 12
months if patient is high-risk. This recommendation follows the
consensus statement: Guidelines for Management of Incidental
Pulmonary Nodules Detected on CT Images: From the [HOSPITAL]
3.

## 2018-11-25 ENCOUNTER — Other Ambulatory Visit: Payer: Self-pay | Admitting: Family Medicine

## 2018-11-25 NOTE — Telephone Encounter (Signed)
Please contact pt to set up appt; then may route back to nurses. Thank you 

## 2018-11-29 NOTE — Telephone Encounter (Signed)
Left message

## 2018-11-29 NOTE — Telephone Encounter (Signed)
Patient is scheduled for in office visit on 12/01/18.

## 2018-12-01 ENCOUNTER — Encounter: Payer: Self-pay | Admitting: Family Medicine

## 2018-12-01 ENCOUNTER — Other Ambulatory Visit: Payer: Self-pay

## 2018-12-01 ENCOUNTER — Ambulatory Visit (INDEPENDENT_AMBULATORY_CARE_PROVIDER_SITE_OTHER): Payer: Medicare HMO | Admitting: Family Medicine

## 2018-12-01 VITALS — BP 132/74 | Temp 97.9°F | Wt 205.0 lb

## 2018-12-01 DIAGNOSIS — Z23 Encounter for immunization: Secondary | ICD-10-CM

## 2018-12-01 DIAGNOSIS — M7552 Bursitis of left shoulder: Secondary | ICD-10-CM

## 2018-12-01 DIAGNOSIS — R7303 Prediabetes: Secondary | ICD-10-CM | POA: Diagnosis not present

## 2018-12-01 DIAGNOSIS — R69 Illness, unspecified: Secondary | ICD-10-CM | POA: Diagnosis not present

## 2018-12-01 DIAGNOSIS — F321 Major depressive disorder, single episode, moderate: Secondary | ICD-10-CM

## 2018-12-01 LAB — POCT GLYCOSYLATED HEMOGLOBIN (HGB A1C): Hemoglobin A1C: 5.4 % (ref 4.0–5.6)

## 2018-12-01 MED ORDER — DICLOFENAC SODIUM 75 MG PO TBEC: 75.0000 mg | DELAYED_RELEASE_TABLET | Freq: Two times a day (BID) | ORAL | 2 refills | Status: DC

## 2018-12-01 MED ORDER — PANTOPRAZOLE SODIUM 40 MG PO TBEC: 40.0000 mg | DELAYED_RELEASE_TABLET | Freq: Every day | ORAL | 1 refills | Status: DC

## 2018-12-01 MED ORDER — CITALOPRAM HYDROBROMIDE 10 MG PO TABS: 10.0000 mg | ORAL_TABLET | Freq: Every day | ORAL | 1 refills | Status: DC

## 2018-12-01 NOTE — Progress Notes (Signed)
   Subjective:    Patient ID: Morgan Hill, female    DOB: Jun 02, 1945, 73 y.o.   MRN: [id number]  HPImed check up.   Prediabetes. A1C today 5.4.   Takes citalopram for depression. Pt states she is doing well on med. Definitely helps. No suic thoughts     Takes protonix for reflux and doing well on med.   Having left arm pain for about 2 months. Deep in the arm, painful aching, deep ahe  Notes pain into the hand also   Has not croced any recently  Working on [place] [employer]  Did not help      Flu vaccine today.   Exercising thee times per week, doing it ref Results for orders placed or performed in visit on 12/01/18  POCT glycosylated hemoglobin (Hb A1C)  Result Value Ref Range   Hemoglobin A1C 5.4 4.0 - 5.6 %   HbA1c POC (<> result, manual entry)     HbA1c, POC (prediabetic range)     HbA1c, POC (controlled diabetic range)     Prediabetes ongoing,   Review of Systems No headache, no major weight loss or weight gain, no chest pain no back pain abdominal pain no change in bowel habits complete ROS otherwise negative     Objective:   Physical Exam Alert and oriented, vitals reviewed and stable, NAD ENT-TM's and ext canals WNL bilat via otoscopic exam Soft palate, tonsils and post pharynx WNL via oropharyngeal exam Neck-symmetric, no masses; thyroid nonpalpable and nontender Pulmonary-no tachypnea or accessory muscle use; Clear without wheezes via auscultation Card--no abnrml murmurs, rhythm reg and rate WNL Carotid pulses symmetric, without bruits Shoulder no true impingement sign, plus minus.  Positive tenderness in the deltoid region.  Difficulty with lateral extension       Assessment & Plan:  Impression shoulder bursitis.  Discussed.  Trial of prescription anti-inflammatory medication.  Codman's exercise discussed  2.  Reflux good control discussed rationale for medicine discussed to maintain  3.  Depression.  Clinically stable maintain  same meds exercise encouraged  4.  Prediabetes.  A1c reassuring diet reemphasized  Follow-up in 6 months diet exercise discussed/flu shot today

## 2018-12-04 ENCOUNTER — Encounter: Payer: Self-pay | Admitting: Family Medicine

## 2019-03-09 ENCOUNTER — Ambulatory Visit: Payer: Medicare HMO | Attending: Internal Medicine

## 2019-03-09 ENCOUNTER — Ambulatory Visit (INDEPENDENT_AMBULATORY_CARE_PROVIDER_SITE_OTHER): Payer: Medicare HMO | Admitting: Family Medicine

## 2019-03-09 ENCOUNTER — Encounter: Payer: Self-pay | Admitting: Family Medicine

## 2019-03-09 ENCOUNTER — Other Ambulatory Visit: Payer: Self-pay

## 2019-03-09 DIAGNOSIS — R531 Weakness: Secondary | ICD-10-CM

## 2019-03-09 DIAGNOSIS — R519 Headache, unspecified: Secondary | ICD-10-CM

## 2019-03-09 DIAGNOSIS — J3489 Other specified disorders of nose and nasal sinuses: Secondary | ICD-10-CM | POA: Diagnosis not present

## 2019-03-09 DIAGNOSIS — B349 Viral infection, unspecified: Secondary | ICD-10-CM

## 2019-03-09 DIAGNOSIS — Z20822 Contact with and (suspected) exposure to covid-19: Secondary | ICD-10-CM | POA: Diagnosis not present

## 2019-03-09 DIAGNOSIS — R05 Cough: Secondary | ICD-10-CM | POA: Diagnosis not present

## 2019-03-09 DIAGNOSIS — R42 Dizziness and giddiness: Secondary | ICD-10-CM

## 2019-03-09 DIAGNOSIS — J019 Acute sinusitis, unspecified: Secondary | ICD-10-CM

## 2019-03-09 MED ORDER — AMOXICILLIN 500 MG PO TABS
500.0000 mg | ORAL_TABLET | Freq: Three times a day (TID) | ORAL | 0 refills | Status: DC
Start: 2019-03-09 — End: 2019-06-07

## 2019-03-09 NOTE — Progress Notes (Signed)
   Subjective:    Patient ID: Morgan Hill, female    DOB: [birth year], 73 y.o.   MRN: [id number]  Headache  This is a new problem. Episode onset: 2 weeks. Associated symptoms include coughing, dizziness, rhinorrhea and weakness. Pertinent negatives include no ear pain or fever. Associated symptoms comments: Dizziness, sinus drainage, dry throat, scratchy throat, weak and shaky after walk, cold sore. Treatments tried: antihistamine. The treatment provided mild relief.  Did discuss with the patient how this could be Covid but could also be even a viral illness certainly if issue gets worse would need further looking into Virtual Visit via Telephone Note  I connected with Morgan Hill on 03/09/19 at  9:30 AM EST by telephone and verified that I am speaking with the correct person using two identifiers.  Location: Patient: home Provider: office   I discussed the limitations, risks, security and privacy concerns of performing an evaluation and management service by telephone and the availability of in person appointments. I also discussed with the patient that there may be a patient responsible charge related to this service. The patient expressed understanding and agreed to proceed.   History of Present Illness:    Observations/Objective:   Assessment and Plan:   Follow Up Instructions:    I discussed the assessment and treatment plan with the patient. The patient was provided an opportunity to ask questions and all were answered. The patient agreed with the plan and demonstrated an understanding of the instructions.   The patient was advised to call back or seek an in-person evaluation if the symptoms worsen or if the condition fails to improve as anticipated.  I provided 17 minutes of non-face-to-face time during this encounter.       Review of Systems  Constitutional: Negative for activity change and fever.  HENT: Positive for congestion and rhinorrhea. Negative for ear  pain.   Eyes: Negative for discharge.  Respiratory: Positive for cough. Negative for shortness of breath and wheezing.   Cardiovascular: Negative for chest pain.  Neurological: Positive for dizziness, weakness and headaches.       Objective:   Physical Exam  Today's visit was via telephone Physical exam was not possible for this visit       Assessment & Plan:  Viral illness Possible Covid Recommend Covid testing Rest up over the next several days Warning signs discussed Recheck if progressive troubles or worse Also possibility of sinus infection will presumptively treat with amoxicillin next 10 days  I touch base with the patient on Saturday.  She was feeling much improved.  Continue current measures.  Her test result was negative.  Patient will touch base with [place] should she get worse

## 2019-03-10 LAB — NOVEL CORONAVIRUS, NAA: SARS-CoV-2, NAA: NOT DETECTED

## 2019-03-27 DIAGNOSIS — H209 Unspecified iridocyclitis: Secondary | ICD-10-CM | POA: Diagnosis not present

## 2019-03-27 DIAGNOSIS — H40051 Ocular hypertension, right eye: Secondary | ICD-10-CM | POA: Diagnosis not present

## 2019-03-30 ENCOUNTER — Encounter: Payer: Self-pay | Admitting: Family Medicine

## 2019-03-30 DIAGNOSIS — H40051 Ocular hypertension, right eye: Secondary | ICD-10-CM | POA: Diagnosis not present

## 2019-03-30 DIAGNOSIS — H209 Unspecified iridocyclitis: Secondary | ICD-10-CM | POA: Diagnosis not present

## 2019-04-11 DIAGNOSIS — H40051 Ocular hypertension, right eye: Secondary | ICD-10-CM | POA: Diagnosis not present

## 2019-04-11 DIAGNOSIS — H209 Unspecified iridocyclitis: Secondary | ICD-10-CM | POA: Diagnosis not present

## 2019-05-02 DIAGNOSIS — H209 Unspecified iridocyclitis: Secondary | ICD-10-CM | POA: Diagnosis not present

## 2019-06-07 ENCOUNTER — Encounter: Payer: Self-pay | Admitting: Family Medicine

## 2019-06-07 ENCOUNTER — Other Ambulatory Visit: Payer: Self-pay

## 2019-06-07 ENCOUNTER — Ambulatory Visit (INDEPENDENT_AMBULATORY_CARE_PROVIDER_SITE_OTHER): Payer: Medicare HMO | Admitting: Family Medicine

## 2019-06-07 VITALS — BP 132/78 | Temp 97.4°F | Wt 206.6 lb

## 2019-06-07 DIAGNOSIS — R7303 Prediabetes: Secondary | ICD-10-CM

## 2019-06-07 DIAGNOSIS — Z13220 Encounter for screening for lipoid disorders: Secondary | ICD-10-CM

## 2019-06-07 DIAGNOSIS — F321 Major depressive disorder, single episode, moderate: Secondary | ICD-10-CM

## 2019-06-07 DIAGNOSIS — Z79899 Other long term (current) drug therapy: Secondary | ICD-10-CM | POA: Diagnosis not present

## 2019-06-07 DIAGNOSIS — R69 Illness, unspecified: Secondary | ICD-10-CM | POA: Diagnosis not present

## 2019-06-07 LAB — POCT GLYCOSYLATED HEMOGLOBIN (HGB A1C): Hemoglobin A1C: 5.5 % (ref 4.0–5.6)

## 2019-06-07 MED ORDER — PANTOPRAZOLE SODIUM 40 MG PO TBEC: 40.0000 mg | DELAYED_RELEASE_TABLET | Freq: Every day | ORAL | 3 refills | Status: DC

## 2019-06-07 MED ORDER — CITALOPRAM HYDROBROMIDE 10 MG PO TABS: 10.0000 mg | ORAL_TABLET | Freq: Every day | ORAL | 3 refills | Status: DC

## 2019-06-07 NOTE — Progress Notes (Signed)
   Subjective:    Patient ID: Morgan Hill, female    DOB: 1945/05/10, 74 y.o.   MRN: [id number]  HPI Pt here today for blood sugar check. Pt states she does not check sugar. No s/s of high or low blood sugar that pt knows of. Pt is unsure of what the signs or symptoms of high or low blood sugar would be.   Pt would like refill on Celexa 10 mg and Pantoprazole 40 mg.   Results for orders placed or performed in visit on 06/07/19  POCT HgB A1C  Result Value Ref Range   Hemoglobin A1C 5.5 4.0 - 5.6 %   HbA1c POC (<> result, manual entry)     HbA1c, POC (prediabetic range)     HbA1c, POC (controlled diabetic range)     Walking a fair amnt,  Not  A lot, trying to stay active  Patient notes ongoing compliance with antidepressant medication. No obvious side effects. Reports does not miss a dose. Overall continues to help depression substantially. No thoughts of homicide or suicide. Would like to maintain medication. Patient was actually off her Celexa for couple of months developed more challenges with anxiety and element of sadness.  Definitely feels she benefits from it long-term  Watching her sugar intake.  Has prediabetes.  See above for exercise level.  See A1c 5.5 actually improved compared to last year  Right knee discomfort.  Intermittent.  Associated with mild swelling.  Not enough to substantially affect her day-to-day life at this point  Review of Systems No headache no chest pain no shortness of breath    Objective:   Physical Exam  Alert vitals stable, NAD. Blood pressure good on repeat. HEENT normal. Lungs clear. Heart regular rate and rhythm. Right knee slight crepitations.  No effusion no joint laxity      Assessment & Plan:  Impression 1 prediabetes.  Clinically stable diet exercise discussed in this regard  2.  Intermittent swelling of right knee.  Warning signs discussed.  Symptom care discussed.  If progresses substantially will need to take to next level  3.   History depression/anxiety clinically stable.  Definitely benefits from meds patient to maintain  4.  Reflux ongoing need for daily meds meds refilled  Appropriate blood work.  Diet exercise discussed.  Medications refilled.  Follow-up in 6 months

## 2019-06-08 DIAGNOSIS — Z13220 Encounter for screening for lipoid disorders: Secondary | ICD-10-CM | POA: Diagnosis not present

## 2019-06-08 DIAGNOSIS — R7303 Prediabetes: Secondary | ICD-10-CM | POA: Diagnosis not present

## 2019-06-08 DIAGNOSIS — Z79899 Other long term (current) drug therapy: Secondary | ICD-10-CM | POA: Diagnosis not present

## 2019-06-09 LAB — LIPID PANEL
Chol/HDL Ratio: 3.5 ratio (ref 0.0–4.4)
Cholesterol, Total: 225 mg/dL — ABNORMAL HIGH (ref 100–199)
HDL: 65 mg/dL (ref >39)
LDL Chol Calc (NIH): 142 mg/dL — ABNORMAL HIGH (ref 0–99)
Triglycerides: 105 mg/dL (ref 0–149)
VLDL Cholesterol Cal: 18 mg/dL (ref 5–40)

## 2019-06-09 LAB — BASIC METABOLIC PANEL
BUN/Creatinine Ratio: 14 (ref 12–28)
BUN: 11 mg/dL (ref 8–27)
CO2: 27 mmol/L (ref 20–29)
Calcium: 9.1 mg/dL (ref 8.7–10.3)
Chloride: 105 mmol/L (ref 96–106)
Creatinine, Ser: 0.8 mg/dL (ref 0.57–1.00)
GFR calc Af Amer: 85 mL/min/{1.73_m2} (ref >59)
GFR calc non Af Amer: 74 mL/min/{1.73_m2} (ref >59)
Glucose: 121 mg/dL — ABNORMAL HIGH (ref 65–99)
Potassium: 4.8 mmol/L (ref 3.5–5.2)
Sodium: 141 mmol/L (ref 134–144)

## 2019-06-09 LAB — HEPATIC FUNCTION PANEL
ALT: 15 IU/L (ref 0–32)
AST: 19 IU/L (ref 0–40)
Albumin: 4.5 g/dL (ref 3.7–4.7)
Alkaline Phosphatase: 61 IU/L (ref 39–117)
Bilirubin Total: 0.7 mg/dL (ref 0.0–1.2)
Bilirubin, Direct: 0.17 mg/dL (ref 0.00–0.40)
Total Protein: 7.5 g/dL (ref 6.0–8.5)

## 2019-06-11 ENCOUNTER — Encounter: Payer: Self-pay | Admitting: Family Medicine

## 2019-06-12 ENCOUNTER — Ambulatory Visit: Payer: Medicare HMO | Admitting: Family Medicine

## 2019-06-13 DIAGNOSIS — H209 Unspecified iridocyclitis: Secondary | ICD-10-CM | POA: Diagnosis not present

## 2019-06-15 DIAGNOSIS — R69 Illness, unspecified: Secondary | ICD-10-CM | POA: Diagnosis not present

## 2019-09-16 ENCOUNTER — Ambulatory Visit (INDEPENDENT_AMBULATORY_CARE_PROVIDER_SITE_OTHER)
Admission: EM | Admit: 2019-09-16 | Discharge: 2019-09-16 | Disposition: A | Discharge status: Home / Self Care | Payer: Medicare HMO | Source: Home / Self Care

## 2019-09-16 ENCOUNTER — Other Ambulatory Visit: Payer: Self-pay

## 2019-09-16 ENCOUNTER — Emergency Department (HOSPITAL_COMMUNITY): Payer: Medicare HMO

## 2019-09-16 ENCOUNTER — Encounter (HOSPITAL_COMMUNITY): Payer: Self-pay | Admitting: *Deleted

## 2019-09-16 ENCOUNTER — Emergency Department (HOSPITAL_COMMUNITY)
Admission: EM | Admit: 2019-09-16 | Discharge: 2019-09-16 | Disposition: A | Discharge status: Home / Self Care | Payer: Medicare HMO | Attending: Emergency Medicine | Admitting: Emergency Medicine

## 2019-09-16 DIAGNOSIS — Z87891 Personal history of nicotine dependence: Secondary | ICD-10-CM | POA: Diagnosis not present

## 2019-09-16 DIAGNOSIS — R0602 Shortness of breath: Secondary | ICD-10-CM | POA: Insufficient documentation

## 2019-09-16 DIAGNOSIS — Z79899 Other long term (current) drug therapy: Secondary | ICD-10-CM | POA: Diagnosis not present

## 2019-09-16 DIAGNOSIS — R69 Illness, unspecified: Secondary | ICD-10-CM | POA: Diagnosis not present

## 2019-09-16 DIAGNOSIS — R42 Dizziness and giddiness: Secondary | ICD-10-CM | POA: Insufficient documentation

## 2019-09-16 DIAGNOSIS — R0789 Other chest pain: Secondary | ICD-10-CM | POA: Diagnosis not present

## 2019-09-16 DIAGNOSIS — Z7952 Long term (current) use of systemic steroids: Secondary | ICD-10-CM | POA: Diagnosis not present

## 2019-09-16 LAB — BASIC METABOLIC PANEL
Anion gap: 10 (ref 5–15)
BUN: 11 mg/dL (ref 8–23)
CO2: 21 mmol/L — ABNORMAL LOW (ref 22–32)
Calcium: 9.1 mg/dL (ref 8.9–10.3)
Chloride: 109 mmol/L (ref 98–111)
Creatinine, Ser: 0.74 mg/dL (ref 0.44–1.00)
GFR calc Af Amer: >60 mL/min (ref >60)
GFR calc non Af Amer: >60 mL/min (ref >60)
Glucose, Bld: 184 mg/dL — ABNORMAL HIGH (ref 70–99)
Potassium: 3.6 mmol/L (ref 3.5–5.1)
Sodium: 140 mmol/L (ref 135–145)

## 2019-09-16 LAB — CBC
HCT: 41.1 % (ref 36.0–46.0)
Hemoglobin: 13.3 g/dL (ref 12.0–15.0)
MCH: 29.8 pg (ref 26.0–34.0)
MCHC: 32.4 g/dL (ref 30.0–36.0)
MCV: 92.2 fL (ref 80.0–100.0)
Platelets: 185 10*3/uL (ref 150–400)
RBC: 4.46 MIL/uL (ref 3.87–5.11)
RDW: 12.7 % (ref 11.5–15.5)
WBC: 5.1 10*3/uL (ref 4.0–10.5)
nRBC: 0 % (ref 0.0–0.2)

## 2019-09-16 LAB — TROPONIN I (HIGH SENSITIVITY)
Troponin I (High Sensitivity): 3 ng/L (ref <18)
Troponin I (High Sensitivity): 3 ng/L (ref <18)

## 2019-09-16 LAB — POCT FASTING CBG KUC MANUAL ENTRY: POCT Glucose (KUC): 120 mg/dL — AB (ref 70–99)

## 2019-09-16 MED ORDER — SODIUM CHLORIDE 0.9% FLUSH: 3.0000 mL | Freq: Once | INTRAVENOUS | Status: DC

## 2019-09-16 MED ORDER — ALUM & MAG HYDROXIDE-SIMETH 200-200-20 MG/5ML PO SUSP
30.0000 mL | Freq: Once | ORAL | Status: AC
  Administered 2019-09-16: 30 mL via ORAL
  Filled 2019-09-16: qty 30

## 2019-09-16 NOTE — ED Notes (Signed)
Pt reports not feeling well for several days   Felt better until she ate a high fat meal   Dr Gerda Diss her PCP - has not seen   Awaiting eval

## 2019-09-16 NOTE — ED Triage Notes (Signed)
Pt presents with onset shortness of breath, dizziness, 1 episode of vomiting, and fatigue since Tuesday.

## 2019-09-16 NOTE — ED Triage Notes (Signed)
Pt c/o burning in her chest with sob and dizziness; pt states she doesn't feel right

## 2019-09-16 NOTE — Discharge Instructions (Addendum)
Your lab tests, exam, ekg's and chest xray are reassuring today.  I suspect your symptoms may be from exacerbation of your GERD and is recommended you increase your pantoprazole to daily dosing.  You may also want to supplement with maalox or mylanta if needed.  I do encourage close followup with Dr. Wyline Mood - call for an appointment with him. In the interim, return here for any new or worsening symptoms.

## 2019-09-16 NOTE — ED Notes (Signed)
Pt ambulated down hall without tachypnea, dyspnea  Ambulated briskly heel to toe with pulse consistant at 87 and sats at 97-98 per cent on room air

## 2019-09-16 NOTE — ED Provider Notes (Signed)
[facility] EMERGENCY DEPARTMENT Provider Note   CSN: [id number] Arrival date & time: 09/16/19  1159     History Chief Complaint  Patient presents with   Chest Pain    Morgan Hill is a 74 y.o. female with a history significant for GERD treated with pantoprazole, prediabetes and prior history of burning bilateral chest pain presenting with a 2-day history of intermittent burning pain which radiates across to her bilateral chest which is randomly occurring but also is significant for shortness of breath with activity such as walking across the room since yesterday.  She had an exercise stress test in 2019 with this prior episode of burning which was a negative study but at that time she did not have the associated shortness of breath.  Her pain is not pleuritic.  No leg pain or swelling.  She denies rash.  She is a former smoker, denies any significant cough, also no fevers or chills, she visited [place] earlier in the week, 4 days ago was there eating lunch after which she promptly vomited multiple times but has been nausea free since, she denies abdominal pain, diarrhea or dysuria.  She has had generalized fatigue and has been sleeping more since her symptoms began.  She historically has taken pantoprazole daily, but over the past several weeks has decreased to every other day at her PCPs suggestion.  She denies any classic acid reflux symptoms with this reduced dosing.  She also denies palpitations, diaphoresis.  She does feel intermittently lightheaded.  She took an aspirin this morning, has found no alleviators and is currently symptom-free.  NM myocard study 04/19/17   Blood pressure demonstrated a hypertensive response to exercise.  There was no ST segment deviation noted during stress.  Defect 1: There is a small defect of mild severity present in the apical inferior and apical lateral location. This is likely due to variable soft tissue attenuation. There is no evidence of  significant ischemia.  This is a low risk study.  Nuclear stress EF: 71%.    The history is provided by the patient.    HPI: A 74 year old patient with a history of hypertension and obesity presents for evaluation of chest pain. Initial onset of pain was more than 6 hours ago. The patient's chest pain is described as heaviness/pressure/tightness and is not worse with exertion. The patient's chest pain is not middle- or left-sided, is not well-localized, is not sharp and does not radiate to the arms/jaw/neck. The patient does not complain of nausea and denies diaphoresis. The patient has no history of stroke, has no history of peripheral artery disease, has not smoked in the past 90 days, denies any history of treated diabetes, has no relevant family history of coronary artery disease (first degree relative at less than age 67) and has no history of hypercholesterolemia.   Past Medical History:  Diagnosis Date   Back pain    CTS (carpal tunnel syndrome)    right   GERD (gastroesophageal reflux disease)     Patient Active Problem List   Diagnosis Date Noted   Depression 07/14/2014   Prediabetes 11/02/2013   GERD (gastroesophageal reflux disease) 05/03/2013   Elevated TSH 05/03/2013   Chest pain 05/02/2013   Obesity 05/02/2013    History reviewed. No pertinent surgical history.   OB History   No obstetric history on file.     Family History  Problem Relation Age of Onset   Hypertension Mother    Diabetes Mother  Heart disease Mother     Social History   Tobacco Use   Smoking status: Former Smoker    Types: Cigarettes    Quit date: 02/24/1995    Years since quitting: 24.5   Smokeless tobacco: Former Neurosurgeon    Quit date: 01/11/1996  Vaping Use   Vaping Use: Never used  Substance Use Topics   Alcohol use: Yes    Alcohol/week: 1.0 standard drink    Types: 1 Glasses of wine per week    Comment: occasional   Drug use: No    Home Medications Prior  to Admission medications   Medication Sig Start Date End Date Taking? Authorizing Provider  aspirin EC 81 MG tablet Take 81 mg by mouth daily. Swallow whole.   Yes [provider]  citalopram (CELEXA) 10 MG tablet Take 1 tablet (10 mg total) by mouth daily. 06/07/19  Yes Merlyn Albert, MD  pantoprazole (PROTONIX) 40 MG tablet Take 1 tablet (40 mg total) by mouth daily. 06/07/19  Yes Merlyn Albert, MD  Propylene Glycol (SYSTANE BALANCE OP) Apply 1 drop to eye in the morning, at noon, and at bedtime. Dry eye   Yes [provider]    Allergies    Patient has no known allergies.  Review of Systems   Review of Systems  Constitutional: Negative for chills, diaphoresis and fever.  HENT: Negative for congestion and sore throat.   Eyes: Negative.   Respiratory: Positive for chest tightness and shortness of breath. Negative for wheezing.   Cardiovascular: Positive for chest pain. Negative for palpitations and leg swelling.  Gastrointestinal: Positive for nausea and vomiting. Negative for abdominal pain.       Nausea and vomiting 4 days ago, none since.  Genitourinary: Negative.  Negative for dysuria.  Musculoskeletal: Negative for arthralgias, joint swelling and neck pain.  Skin: Negative.  Negative for rash and wound.  Neurological: Negative for dizziness, weakness, light-headedness, numbness and headaches.  Psychiatric/Behavioral: Negative.     Physical Exam Updated Vital Signs BP (!) 150/67    Pulse 69    Temp 98.2 F (36.8 C) (Oral)    Resp 22    Ht 5\' 4"  (1.626 m)    Wt 89.5 kg    SpO2 100%    BMI 33.88 kg/m   Physical Exam Vitals and nursing note reviewed.  Constitutional:      Appearance: She is well-developed.  HENT:     Head: Normocephalic and atraumatic.  Eyes:     Conjunctiva/sclera: Conjunctivae normal.  Cardiovascular:     Rate and Rhythm: Normal rate and regular rhythm.     Heart sounds: Normal heart sounds.  Pulmonary:     Effort: Pulmonary  effort is normal.     Breath sounds: Normal breath sounds. No wheezing.  Abdominal:     General: Bowel sounds are normal.     Palpations: Abdomen is soft.     Tenderness: There is no abdominal tenderness.  Musculoskeletal:        General: Normal range of motion.     Cervical back: Normal range of motion.     Right lower leg: No tenderness. No edema.     Left lower leg: No tenderness. No edema.  Skin:    General: Skin is warm and dry.  Neurological:     Mental Status: She is alert.     ED Results / Procedures / Treatments   Labs (all labs ordered are listed, but only abnormal results are displayed)  Labs Reviewed  BASIC METABOLIC PANEL - Abnormal; Notable for the following components:      Result Value   CO2 21 (*)    Glucose, Bld 184 (*)    All other components within normal limits  CBC  TROPONIN I (HIGH SENSITIVITY)  TROPONIN I (HIGH SENSITIVITY)    EKG EKG Interpretation  Date/Time:  Saturday September 16 2019 15:54:42 EDT Ventricular Rate:  55 PR Interval:    QRS Duration: 102 QT Interval:  468 QTC Calculation: 448 R Axis:   50 Text Interpretation: Sinus rhythm Normal ECG mild bradycardia, no other acute findings Confirmed by Eber Hong ([id number]) on 09/16/2019 4:44:05 PM  Unchanged from first ekg completed at 12:12pm, except rate was 82.   Radiology DG Chest Portable 1 View  Result Date: 09/16/2019 CLINICAL DATA:  Burning sensation in chest and shortness of breath for 2-3 days. Ex-smoker. EXAM: PORTABLE CHEST 1 VIEW COMPARISON:  Radiographs 05/03/2017 and 04/09/2017.  CT 05/20/2017. FINDINGS: 1303 hours. The heart size and mediastinal contours are stable. There is stable linear scarring in the right upper lobe and lingula. The lungs are otherwise clear. There is no pleural effusion or pneumothorax. The bones appear unremarkable. IMPRESSION: Stable chest. No active cardiopulmonary process. Electronically Signed   By: Carey Bullocks M.D.   On: 09/16/2019 13:46     Procedures Procedures (including critical care time)  Medications Ordered in ED Medications  alum & mag hydroxide-simeth (MAALOX/MYLANTA) 200-200-20 MG/5ML suspension 30 mL (30 mLs Oral Given 09/16/19 1603)    ED Course  I have reviewed the triage vital signs and the nursing notes.  Pertinent labs & imaging results that were available during my care of the patient were reviewed by me and considered in my medical decision making (see chart for details).    MDM Rules/Calculators/A&P HEAR Score: 3                        Labs and imaging reviewed, ekg normal.  Sx free at time of initial evaluation.  5:33 PM After second troponin resulted, pt noted return of her burning cp, non exertional, has been sitting on her gurney. Repeat ekg. Trial of maalox given. Sx were resolved by time she got the maalox so no appreciable direct improvement with the maalox. Sx are atypical for ACS or unstable angina.  Sx most suggestive of Gerd exacerbation.  She was encouraged to increase her PPI to qd dosing, but also given age risk factor, advised f/u with cardiology for recheck exam.  Referral back to Dr Wyline Mood recommended.  Pt will call Monday for appt.  Return precautions outlined.  Final Clinical Impression(s) / ED Diagnoses Final diagnoses:  Atypical chest pain    Rx / DC Orders ED Discharge Orders    None       Victoriano Lain 09/16/19 1733    Bethann Berkshire, MD 09/17/19 [phone]

## 2019-09-16 NOTE — ED Provider Notes (Signed)
[facility]   [id number] 09/16/19 Arrival Time: 1013   CC: Multiple complaints  SUBJECTIVE:  Morgan Hill is a 74 y.o. female who presents with complaint of lightheadedness, fatigue, chest discomfort, and SOB with exertion x 4 days.  Denies a precipitating event, trauma, recent lower respiratory tract, or strenuous upper body activities.  Localizes chest pain to the across chest.  Describes as burning, currently asymptomatic.  Has tried baby aspirin without relief.  Symptoms made worse with activity.  Reports previous symptoms in the past with acid reflux.  Denies fever, chills, palpitations, tachycardia, SOB, nausea, vomiting, abdominal pain, changes in bowel or bladder habits, diaphoresis, numbness/tingling in extremities, peripheral edema.    Does admit to recent travel to [place]  Denies SOB, calf pain or swelling, recent surgery, pregnancy, malignancy, tobacco use, hormone use, or previous blood clot  Reports previous cardiac work-up and was told it was related to GERD.    ROS: As per HPI.  All other pertinent ROS negative.    Past Medical History:  Diagnosis Date  . Back pain   . CTS (carpal tunnel syndrome)    right  . GERD (gastroesophageal reflux disease)    History reviewed. No pertinent surgical history. No Known Allergies No current facility-administered medications on file prior to encounter.   Current Outpatient Medications on File Prior to Encounter  Medication Sig Dispense Refill  . citalopram (CELEXA) 10 MG tablet Take 1 tablet (10 mg total) by mouth daily. 90 tablet 3  . pantoprazole (PROTONIX) 40 MG tablet Take 1 tablet (40 mg total) by mouth daily. 90 tablet 3   Social History   Socioeconomic History  . Marital status: Single    Spouse name: Not on file  . Number of children: Not on file  . Years of education: Not on file  . Highest education level: Not on file  Occupational History  . Not on file  Tobacco Use  . Smoking status: Former  Smoker    Types: Cigarettes    Quit date: 02/24/1995    Years since quitting: 24.5  . Smokeless tobacco: Former Neurosurgeon    Quit date: 01/11/1996  Vaping Use  . Vaping Use: Never used  Substance and Sexual Activity  . Alcohol use: Yes    Alcohol/week: 1.0 standard drink    Types: 1 Glasses of wine per week    Comment: occasional  . Drug use: No  . Sexual activity: Not on file  Other Topics Concern  . Not on file  Social History Narrative  . Not on file   Social Determinants of Health   Financial Resource Strain:   . Difficulty of Paying Living Expenses:   Food Insecurity:   . Worried About Programme researcher, broadcasting/film/video in the Last Year:   . Barista in the Last Year:   Transportation Needs:   . Freight forwarder (Medical):   Marland Kitchen Lack of Transportation (Non-Medical):   Physical Activity:   . Days of Exercise per Week:   . Minutes of Exercise per Session:   Stress:   . Feeling of Stress :   Social Connections:   . Frequency of Communication with Friends and Family:   . Frequency of Social Gatherings with Friends and Family:   . Attends Religious Services:   . Active Member of Clubs or Organizations:   . Attends Banker Meetings:   Marland Kitchen Marital Status:   Intimate Partner Violence:   . Fear of Current or  Ex-Partner:   . Emotionally Abused:   Marland Kitchen Physically Abused:   . Sexually Abused:    Family History  Problem Relation Age of Onset  . Hypertension Mother   . Diabetes Mother   . Heart disease Mother      OBJECTIVE:  Vitals:   09/16/19 1033  BP: (!) 183/82  Pulse: 65  Resp: 16  Temp: 98.7 F (37.1 C)  TempSrc: Oral  SpO2: 96%    General appearance: alert; no distress Eyes: PERRLA; EOMI; conjunctiva normal HENT: normocephalic; atraumatic Neck: supple Lungs: clear to auscultation bilaterally without adventitious breath sounds Heart: regular rate and rhythm.  Clear S1 and S2 without rubs, gallops, or murmur. Abdomen: soft, non-tender; bowel sounds  normal; no guarding Extremities: no cyanosis or edema; symmetrical with no gross deformities Skin: warm and dry Psychological: alert and cooperative; anxious mood and affect  ECG: Orders placed or performed during the hospital encounter of 09/16/19  . ED EKG  . ED EKG    EKG normal sinus rhythm without ST elevations, depressions, or prolonged PR interval.  No narrowing or widening of the QRS complexes.  Comparable to past EKG  LABS:  Results for orders placed or performed during the hospital encounter of 09/16/19  POCT CBG (manual entry)  Result Value Ref Range   POCT Glucose ([facility]) 120 (A) 70 - 99 mg/dL   Labs Reviewed  POCT FASTING CBG [facility] MANUAL ENTRY - Abnormal; Notable for the following components:      Result Value   POCT Glucose ([facility]) 120 (*)    All other components within normal limits    ASSESSMENT & PLAN:  1. Lightheadedness   2. Chest discomfort   3. SOB (shortness of breath) on exertion    Offered outpatient therapy for acid reflux in office with close follow up with PCP.  Patient and daughter would like to rule out cardiac cause and blood clot.    Unable to rule out cardiac disease or blood clot in urgent care setting.  Offered patient further evaluation and management in the ED.  Patient is agreeable and will go by private vehicle to [facility] ED.     Rennis Harding, PA-C 09/16/19 1128

## 2019-09-19 ENCOUNTER — Telehealth: Payer: Self-pay | Admitting: *Deleted

## 2019-09-19 NOTE — Telephone Encounter (Signed)
Patient called stating she started having tightness/burning in her chest, slight light headedness, fatigued and shaky last [place]. She went to the ER on 7/24 and they ran several tests- didn't see anything wrong, told her they thought it was acid reflux. Symptoms come and go and patient is already taking protonix and mylanta. Patient calling for appointment this week but we do not have any openings--please advise when patient needs to be seen.

## 2019-09-19 NOTE — Telephone Encounter (Signed)
Give the pt the 3:50pm visit tomorrow (wed).  Thx, dr. Ladona Ridgel

## 2019-09-20 ENCOUNTER — Other Ambulatory Visit: Payer: Self-pay

## 2019-09-20 ENCOUNTER — Encounter: Payer: Self-pay | Admitting: Family Medicine

## 2019-09-20 ENCOUNTER — Ambulatory Visit (INDEPENDENT_AMBULATORY_CARE_PROVIDER_SITE_OTHER): Payer: Medicare HMO | Admitting: Family Medicine

## 2019-09-20 VITALS — BP 118/74 | HR 78 | Temp 97.7°F | Ht 64.0 in | Wt 197.0 lb

## 2019-09-20 DIAGNOSIS — K219 Gastro-esophageal reflux disease without esophagitis: Secondary | ICD-10-CM

## 2019-09-20 DIAGNOSIS — R42 Dizziness and giddiness: Secondary | ICD-10-CM | POA: Diagnosis not present

## 2019-09-20 MED ORDER — MECLIZINE HCL 25 MG PO TABS: 25.0000 mg | ORAL_TABLET | Freq: Three times a day (TID) | ORAL | 0 refills | Status: DC | PRN

## 2019-09-20 NOTE — Telephone Encounter (Signed)
Patient notified

## 2019-09-20 NOTE — Patient Instructions (Signed)
Dizziness Dizziness is a common problem. It is a feeling of unsteadiness or light-headedness. You may feel like you are about to faint. Dizziness can lead to injury if you stumble or fall. Anyone can become dizzy, but dizziness is more common in older adults. This condition can be caused by a number of things, including medicines, dehydration, or illness. Follow these instructions at home: Eating and drinking  Drink enough fluid to keep your urine clear or pale yellow. This helps to keep you from becoming dehydrated. Try to drink more clear fluids, such as water.  Do not drink alcohol.  Limit your caffeine intake if told to do so by your health care provider. Check ingredients and nutrition facts to see if a food or beverage contains caffeine.  Limit your salt (sodium) intake if told to do so by your health care provider. Check ingredients and nutrition facts to see if a food or beverage contains sodium. Activity  Avoid making quick movements. ? Rise slowly from chairs and steady yourself until you feel okay. ? In the morning, first sit up on the side of the bed. When you feel okay, stand slowly while you hold onto something until you know that your balance is fine.  If you need to stand in one place for a long time, move your legs often. Tighten and relax the muscles in your legs while you are standing.  Do not drive or use heavy machinery if you feel dizzy.  Avoid bending down if you feel dizzy. Place items in your home so that they are easy for you to reach without leaning over. Lifestyle  Do not use any products that contain nicotine or tobacco, such as cigarettes and e-cigarettes. If you need help quitting, ask your health care provider.  Try to reduce your stress level by using methods such as yoga or meditation. Talk with your health care provider if you need help to manage your stress. General instructions  Watch your dizziness for any changes.  Take over-the-counter and  prescription medicines only as told by your health care provider. Talk with your health care provider if you think that your dizziness is caused by a medicine that you are taking.  Tell a friend or a family member that you are feeling dizzy. If he or she notices any changes in your behavior, have this person call your health care provider.  Keep all follow-up visits as told by your health care provider. This is important. Contact a health care provider if:  Your dizziness does not go away.  Your dizziness or light-headedness gets worse.  You feel nauseous.  You have reduced hearing.  You have new symptoms.  You are unsteady on your feet or you feel like the room is spinning. Get help right away if:  You vomit or have diarrhea and are unable to eat or drink anything.  You have problems talking, walking, swallowing, or using your arms, hands, or legs.  You feel generally weak.  You are not thinking clearly or you have trouble forming sentences. It may take a friend or family member to notice this.  You have chest pain, abdominal pain, shortness of breath, or sweating.  Your vision changes.  You have any bleeding.  You have a severe headache.  You have neck pain or a stiff neck.  You have a fever. These symptoms may represent a serious problem that is an emergency. Do not wait to see if the symptoms will go away. Get medical help   right away. Call your local emergency services (911 in the [place] Do not drive yourself to the hospital. Summary Dizziness is a feeling of unsteadiness or light-headedness. This condition can be caused by a number of things, including medicines, dehydratio Vertigo Vertigo is the feeling that you or the things around you are moving when they are not. This feeling can come and go at any time. Vertigo often goes away on its own. This condition can be dangerous if it happens when you are doing activities like driving or working with machines. Your doctor  will do tests to find the cause of your vertigo. These tests will also help your doctor decide on the best treatment for you. Follow these instructions at home: Eating and drinking     Drink enough fluid to keep your pee (urine) pale yellow. Do not drink alcohol. Activity Return to your normal activities as told by your doctor. Ask your doctor what activities are safe for you. In the morning, first sit up on the side of the bed. When you feel okay, stand slowly while you hold onto something until you know that your balance is fine. Move slowly. Avoid sudden body or head movements or certain positions, as told by your doctor. Use a cane if you have trouble standing or walking. Sit down right away if you feel dizzy. Avoid doing any tasks or activities that can cause danger to you or others if you get dizzy. Avoid bending down if you feel dizzy. Place items in your home so that they are easy for you to reach without leaning over. Do not drive or use heavy machinery if you feel dizzy. General instructions Take over-the-counter and prescription medicines only as told by your doctor. Keep all follow-up visits as told by your doctor. This is important. Contact a doctor if: Your medicine does not help your vertigo. You have a fever. Your problems get worse or you have new symptoms. Your family or friends see changes in your behavior. The feeling of being sick to your stomach gets worse. Your vomiting gets worse. You lose feeling (have numbness) in part of your body. You feel prickling and tingling in a part of your body. Get help right away if: You have trouble moving or talking. You are always dizzy. You pass out (faint). You get very bad headaches. You feel weak in your hands, arms, or legs. You have changes in your hearing. You have changes in how you see (vision). You get a stiff neck. Bright light starts to bother you. Summary Vertigo is the feeling that you or the things around  you are moving when they are not. Your doctor will do tests to find the cause of your vertigo. You may be told to avoid some tasks, positions, or movements. Contact a doctor if your medicine is not helping, or if you have a fever, new symptoms, or a change in behavior. Get help right away if you get very bad headaches, or if you have changes in how you speak, hear, or see. This information is not intended to replace advice given to you by your health care provider. Make sure you discuss any questions you have with your health care provider. Document Revised: 01/03/2018 Document Reviewed: 01/03/2018 Elsevier Patient Education  2020 [employer].  n, or illness.  Anyone can become dizzy, but dizziness is more common in older adults.  Drink enough fluid to keep your urine clear or pale yellow. Do not drink alcohol.  Avoid making quick  movements if you feel dizzy. Monitor your dizziness for any changes. This information is not intended to replace advice given to you by your health care provider. Make sure you discuss any questions you have with your health care provider. Document Revised: 02/12/2017 Document Reviewed: 03/14/2016 Elsevier Patient Education  2020 [employer].

## 2019-09-20 NOTE — Progress Notes (Signed)
Patient ID: Morgan Hill, female    DOB: Jan 19, 1946, 74 y.o.   MRN: [id number]   Chief Complaint  Patient presents with  . Follow-up   Subjective:    HPI  Follow up ED visit for chest pain and dizziness. Feeling about the same as when at the ED.   Pt seen for f/u after ER visit. Had ekg and was normal.  Labs overall normal range and neg troponin x2. Had NM myocardial study in 2/19. Treated for GERD in ED and improved. Pt was advised by ED to increase PPI to daily dosing and use maalox prn. F/u with cards, Dr. Wyline Mood.   Pain in chest is bilateral and burning.  Has been going on 2 days prior to going to Er. Intermittent and with some sob. At times has intermittent dizziness.  No n/v/d, or fever. Noticed the dizziness when driving up elevation to [place] and eating lunch.  Feeling needing to sit down to rest and resolved on it's own.  Medical History Morgan Hill has a past medical history of Back pain, CTS (carpal tunnel syndrome), and GERD (gastroesophageal reflux disease).   Outpatient Encounter Medications as of 09/20/2019  Medication Sig  . citalopram (CELEXA) 10 MG tablet Take 1 tablet (10 mg total) by mouth daily.  Marland Kitchen OVER THE COUNTER MEDICATION Vit d 3 2000 iu daily magnessium  . pantoprazole (PROTONIX) 40 MG tablet Take 1 tablet (40 mg total) by mouth daily.  Marland Kitchen Propylene Glycol (SYSTANE BALANCE OP) Apply 1 drop to eye in the morning, at noon, and at bedtime. Dry eye  . aspirin EC 81 MG tablet Take 81 mg by mouth daily. Swallow whole.  . meclizine (ANTIVERT) 25 MG tablet Take 1 tablet (25 mg total) by mouth 3 (three) times daily as needed for dizziness.   No facility-administered encounter medications on file as of 09/20/2019.     Review of Systems  Constitutional: Negative for chills and fever.  HENT: Negative for congestion, rhinorrhea and sore throat.   Respiratory: Negative for cough, shortness of breath and wheezing.   Cardiovascular: Positive for chest pain.  Negative for palpitations and leg swelling.  Gastrointestinal: Negative for abdominal pain, diarrhea, nausea and vomiting.  Genitourinary: Negative for dysuria and frequency.  Musculoskeletal: Negative for arthralgias and back pain.  Skin: Negative for rash.  Neurological: Positive for dizziness. Negative for syncope, weakness and headaches.     Vitals BP 118/74   Pulse 78   Temp 97.7 F (36.5 C)   Ht 5\' 4"  (1.626 m)   Wt 197 lb (89.4 kg)   SpO2 97%   BMI 33.81 kg/m   Objective:   Physical Exam Vitals and nursing note reviewed.  Constitutional:      Appearance: Normal appearance.  HENT:     Head: Normocephalic and atraumatic.     Nose: Nose normal.     Mouth/Throat:     Mouth: Mucous membranes are moist.     Pharynx: Oropharynx is clear.  Eyes:     Extraocular Movements: Extraocular movements intact.     Conjunctiva/sclera: Conjunctivae normal.     Pupils: Pupils are equal, round, and reactive to light.  Cardiovascular:     Rate and Rhythm: Normal rate and regular rhythm.     Pulses: Normal pulses.     Heart sounds: Normal heart sounds. No murmur heard.   Pulmonary:     Effort: Pulmonary effort is normal. No respiratory distress.     Breath sounds: Normal breath sounds. No  wheezing, rhonchi or rales.  Musculoskeletal:        General: Normal range of motion.     Right lower leg: No edema.     Left lower leg: No edema.  Skin:    General: Skin is warm and dry.     Findings: No lesion or rash.  Neurological:     General: No focal deficit present.     Mental Status: She is alert and oriented to person, place, and time.     Cranial Nerves: No cranial nerve deficit.     Sensory: No sensory deficit.     Motor: No weakness.     Gait: Gait normal.  Psychiatric:        Mood and Affect: Mood normal.        Behavior: Behavior normal.        Thought Content: Thought content normal.        Judgment: Judgment normal.      Assessment and Plan   1. Vertigo - CT  Head Wo Contrast; Future  2. Dizziness - CT Head Wo Contrast; Future  3. Gastroesophageal reflux disease, unspecified whether esophagitis present   F/u with Cards, Dr. Wyline Mood or rto if worsening. Dizziness/vertigo- obtain head CT.  Normal neuro exam today.  GERD- take protonix daily.   Pt in agreement.

## 2019-09-26 ENCOUNTER — Telehealth: Payer: Self-pay | Admitting: Family Medicine

## 2019-09-26 NOTE — Telephone Encounter (Signed)
Need 09/20/2019 OV note completed to use for CT prior auth process (pt was seen by Dr. Ladona Ridgel)

## 2019-09-29 NOTE — Telephone Encounter (Signed)
Completed thx! Dr. Karie Schwalbe

## 2019-10-02 NOTE — Telephone Encounter (Signed)
That's fine if pt feeling better we can hold off on the head Ct.   Thx,   Dr. Ladona Ridgel

## 2019-10-02 NOTE — Telephone Encounter (Signed)
Called to give patient CT appointment information - patient states she would like to speak with Dr. Ladona Ridgel at her follow up visit to see if the CT is still needed  Will cancel appointment for CT per patient request, patient states she doesn't know where to go in [place] ([facility] was preferred location due to [employer])  Prior Berkley Harvey is complete, will need to contact [facility] to change facility to [facility] due to patient's location preference.

## 2019-10-05 ENCOUNTER — Ambulatory Visit (INDEPENDENT_AMBULATORY_CARE_PROVIDER_SITE_OTHER): Payer: Medicare HMO | Admitting: Family Medicine

## 2019-10-05 ENCOUNTER — Other Ambulatory Visit: Payer: Self-pay

## 2019-10-05 ENCOUNTER — Encounter: Payer: Self-pay | Admitting: Family Medicine

## 2019-10-05 VITALS — BP 148/78 | HR 77 | Temp 97.7°F | Wt 198.0 lb

## 2019-10-05 DIAGNOSIS — R42 Dizziness and giddiness: Secondary | ICD-10-CM | POA: Diagnosis not present

## 2019-10-05 DIAGNOSIS — J0110 Acute frontal sinusitis, unspecified: Secondary | ICD-10-CM

## 2019-10-05 MED ORDER — AMOXICILLIN 500 MG PO CAPS: 500.0000 mg | ORAL_CAPSULE | Freq: Three times a day (TID) | ORAL | 0 refills | Status: DC

## 2019-10-05 NOTE — Progress Notes (Signed)
Patient ID: Morgan Hill, female    DOB: [birth year], 74 y.o.   MRN: [id number]   Chief Complaint  Patient presents with  . Follow-up    vertigo and dizziness. Patient states she has been feeling better, has not taken meclizine since sunday.    Subjective:    HPI Feeling better from the vertigo.  No further feeling of dizziness or off balance. Went from 2 per day to 1 per day of meclazine.  The chest discomfort resolved.  Now having drainage in throat.  No coughing.  Has h/o sinus problems in past. In Jan 2021 had dizziness and drainage.  And feels this episode is the same as how she presented with sinus infection then.  covid was negative. Pt was given amoxicillin at that time and improved. No pain in facial area.  Clearing throat. No coughing at night or fever.  Using flonase some.   Medical History Morgan Hill has a past medical history of Back pain, CTS (carpal tunnel syndrome), and GERD (gastroesophageal reflux disease).   Outpatient Encounter Medications as of 10/05/2019  Medication Sig  . amoxicillin (AMOXIL) 500 MG capsule Take 1 capsule (500 mg total) by mouth 3 (three) times daily.  Marland Kitchen aspirin EC 81 MG tablet Take 81 mg by mouth daily. Swallow whole.  . citalopram (CELEXA) 10 MG tablet Take 1 tablet (10 mg total) by mouth daily.  . meclizine (ANTIVERT) 25 MG tablet Take 1 tablet (25 mg total) by mouth 3 (three) times daily as needed for dizziness. (Patient not taking: Reported on 10/05/2019)  . OVER THE COUNTER MEDICATION Vit d 3 2000 iu daily magnessium  . pantoprazole (PROTONIX) 40 MG tablet Take 1 tablet (40 mg total) by mouth daily.  Marland Kitchen Propylene Glycol (SYSTANE BALANCE OP) Apply 1 drop to eye in the morning, at noon, and at bedtime. Dry eye   No facility-administered encounter medications on file as of 10/05/2019.     Review of Systems  Constitutional: Negative for chills and fever.  HENT: Positive for postnasal drip. Negative for congestion, ear pain, rhinorrhea,  sinus pressure, sinus pain and sore throat.   Eyes: Negative for pain, discharge and itching.  Respiratory: Negative for cough, shortness of breath and wheezing.   Cardiovascular: Negative for chest pain.  Gastrointestinal: Negative for constipation, diarrhea, nausea and vomiting.  Neurological: Negative for dizziness, weakness, light-headedness, numbness and headaches.     Vitals BP (!) 148/78   Pulse 77   Temp 97.7 F (36.5 C)   Wt 198 lb (89.8 kg)   SpO2 98%   BMI 33.99 kg/m   Objective:   Physical Exam Vitals and nursing note reviewed.  Constitutional:      General: She is not in acute distress.    Appearance: Normal appearance. She is not toxic-appearing.  HENT:     Head: Normocephalic and atraumatic.     Right Ear: Tympanic membrane, ear canal and external ear normal.     Left Ear: Tympanic membrane, ear canal and external ear normal.     Nose: Nose normal. No congestion or rhinorrhea.     Mouth/Throat:     Mouth: Mucous membranes are moist.     Pharynx: Oropharynx is clear. No oropharyngeal exudate or posterior oropharyngeal erythema.  Eyes:     Extraocular Movements: Extraocular movements intact.     Conjunctiva/sclera: Conjunctivae normal.     Pupils: Pupils are equal, round, and reactive to light.  Pulmonary:     Effort: Pulmonary effort is normal.  No respiratory distress.  Musculoskeletal:     Cervical back: Normal range of motion.  Lymphadenopathy:     Cervical: No cervical adenopathy.  Skin:    General: Skin is warm and dry.     Findings: No rash.  Neurological:     General: No focal deficit present.     Mental Status: She is alert and oriented to person, place, and time.     Cranial Nerves: No cranial nerve deficit.     Motor: No weakness.     Gait: Gait normal.  Psychiatric:        Mood and Affect: Mood normal.        Behavior: Behavior normal.      Assessment and Plan   1. Acute non-recurrent frontal sinusitis - amoxicillin (AMOXIL) 500  MG capsule; Take 1 capsule (500 mg total) by mouth 3 (three) times daily.  Dispense: 30 capsule; Refill: 0  2. Vertigo   Vertigo-resolved. Pt feeling better and pt not feeling like she needs the CT scan at this time.  Will hold off on imaging since her vertigo has resolved.    Will treat for sinusitis and give amoxicillin.  Cont flonase, throat lozenges and fluids.  Call or rto if not improving in 3-5 days.  F/u prn.

## 2019-10-23 ENCOUNTER — Telehealth (INDEPENDENT_AMBULATORY_CARE_PROVIDER_SITE_OTHER): Payer: Medicare HMO | Admitting: Family Medicine

## 2019-10-23 ENCOUNTER — Telehealth: Payer: Self-pay | Admitting: Family Medicine

## 2019-10-23 ENCOUNTER — Other Ambulatory Visit: Payer: Self-pay

## 2019-10-23 ENCOUNTER — Encounter: Payer: Self-pay | Admitting: Family Medicine

## 2019-10-23 DIAGNOSIS — J019 Acute sinusitis, unspecified: Secondary | ICD-10-CM

## 2019-10-23 DIAGNOSIS — B9689 Other specified bacterial agents as the cause of diseases classified elsewhere: Secondary | ICD-10-CM | POA: Diagnosis not present

## 2019-10-23 MED ORDER — AZITHROMYCIN 250 MG PO TABS: ORAL_TABLET | ORAL | 0 refills | Status: DC

## 2019-10-23 NOTE — Progress Notes (Signed)
Pt having continued sinus infection and vertigo. Pt has been having headache, drainage, phelgm, ears feel heavy and feel the need to pop, ringing in ear,scratchy throat, right ear pain. Pt was seen on 10/05/19 by Dr.Taylor and prescribed Amoxicillin for 10 days but no relief. Pt has also had vertigo about one to two weeks.   Virtual Visit via Telephone Note  I connected with Morgan Hill on 10/23/19 at  9:50 AM EDT by telephone and verified that I am speaking with the correct person using two identifiers.  Location: Patient: home Provider: office   I discussed the limitations, risks, security and privacy concerns of performing an evaluation and management service by telephone and the availability of in person appointments. I also discussed with the patient that there may be a patient responsible charge related to this service. The patient expressed understanding and agreed to proceed.   History of Present Illness:    Observations/Objective:   Assessment and Plan:   Follow Up Instructions:    I discussed the assessment and treatment plan with the patient. The patient was provided an opportunity to ask questions and all were answered. The patient agreed with the plan and demonstrated an understanding of the instructions.   The patient was advised to call back or seek an in-person evaluation if the symptoms worsen or if the condition fails to improve as anticipated.  I provided 12 minutes of non-face-to-face time during this encounter.    Patient ID: Morgan Hill, female    DOB: Jul 07, 1945, 74 y.o.   MRN: [id number]   Chief Complaint  Patient presents with  . Sinusitis   Subjective:    HPI Treated for sinus infection with 10 days of Amox. Not better. No fever. Sinus pressure and drainage in back of throat.   Medical History Ermalee has a past medical history of Back pain, CTS (carpal tunnel syndrome), and GERD (gastroesophageal reflux disease).   Outpatient Encounter  Medications as of 10/23/2019  Medication Sig  . citalopram (CELEXA) 10 MG tablet Take 1 tablet (10 mg total) by mouth daily.  . meclizine (ANTIVERT) 25 MG tablet Take 1 tablet (25 mg total) by mouth 3 (three) times daily as needed for dizziness.  Marland Kitchen OVER THE COUNTER MEDICATION Vit d 3 2000 iu daily magnessium  . pantoprazole (PROTONIX) 40 MG tablet Take 1 tablet (40 mg total) by mouth daily.  Marland Kitchen Propylene Glycol (SYSTANE BALANCE OP) Apply 1 drop to eye in the morning, at noon, and at bedtime. Dry eye  . azithromycin (ZITHROMAX) 250 MG tablet Take two tablets today, then one tablet for next 4 days.  . [DISCONTINUED] amoxicillin (AMOXIL) 500 MG capsule Take 1 capsule (500 mg total) by mouth 3 (three) times daily.  . [DISCONTINUED] aspirin EC 81 MG tablet Take 81 mg by mouth daily. Swallow whole.   No facility-administered encounter medications on file as of 10/23/2019.     Review of Systems  Constitutional: Negative for chills and fever.  HENT: Positive for ear pain, sinus pressure and tinnitus. Negative for congestion.        Drainage in back of throat. Right ear pain last evening only- none today. Sinus pressure. Took Amoxicillin x 10 days for sinus infection- not better. No fever. Tinnitus. Took Benadryl and it only "dried her out"  Eyes: Negative.   Respiratory: Negative for shortness of breath.      Vitals There were no vitals taken for this visit.  Objective:   Physical Exam Unable to preform-  this is a phone visit.  Assessment and Plan   1. Acute bacterial rhinosinusitis - azithromycin (ZITHROMAX) 250 MG tablet; Take two tablets today, then one tablet for next 4 days.  Dispense: 6 tablet; Refill: 0   I spoke with Morgan Hill via telephone visit. She took Amoxicillin for 10 days for sinus infection and continues to have sinus pressure and drainage in back of her throat. No fever. Had right ear pain last night- none today. Took Benadryl and it only dried her out.  Start Zyrtec  OTC for 7 days as instructed on package. Start Z-pak today as instructed. Hydrate. Do not take Benadryl.  Agrees with plan of care discussed today. Understands warning signs to seek further care: fever, increased pain, shortness of breath, chest pain.  Understands to follow-up if symptoms do not improve or if anything changes.  She will let us know if she is not improving in a few days.   Novella Olive, NP 10/23/2019

## 2019-10-23 NOTE — Telephone Encounter (Signed)
Ms. Morgan Hill, Morgan Hill are scheduled for a virtual visit with your provider today.    Just as we do with appointments in the office, we must obtain your consent to participate.  Your consent will be active for this visit and any virtual visit you may have with one of our providers in the next 365 days.    If you have a MyChart account, I can also send a copy of this consent to you electronically.  All virtual visits are billed to your insurance company just like a traditional visit in the office.  As this is a virtual visit, video technology does not allow for your provider to perform a traditional examination.  This may limit your provider's ability to fully assess your condition.  If your provider identifies any concerns that need to be evaluated in person or the need to arrange testing such as labs, EKG, etc, we will make arrangements to do so.    Although advances in technology are sophisticated, we cannot ensure that it will always work on either your end or our end.  If the connection with a video visit is poor, we may have to switch to a telephone visit.  With either a video or telephone visit, we are not always able to ensure that we have a secure connection.   I need to obtain your verbal consent now.   Are you willing to proceed with your visit today?   Morgan Hill has provided verbal consent on 10/23/2019 for a virtual visit (video or telephone).   Marlowe Shores, LPN 1/61/0960  4:54 AM

## 2019-12-08 ENCOUNTER — Encounter: Payer: Self-pay | Admitting: Emergency Medicine

## 2019-12-08 ENCOUNTER — Ambulatory Visit
Admission: EM | Admit: 2019-12-08 | Discharge: 2019-12-08 | Disposition: A | Discharge status: Home / Self Care | Payer: Medicare HMO | Attending: Emergency Medicine | Admitting: Emergency Medicine

## 2019-12-08 ENCOUNTER — Other Ambulatory Visit: Payer: Self-pay

## 2019-12-08 DIAGNOSIS — M542 Cervicalgia: Secondary | ICD-10-CM

## 2019-12-08 MED ORDER — TIZANIDINE HCL 2 MG PO CAPS
2.0000 mg | ORAL_CAPSULE | Freq: Every evening | ORAL | 0 refills | Status: DC | PRN
Start: 2019-12-08 — End: 2020-07-17

## 2019-12-08 MED ORDER — PREDNISONE 20 MG PO TABS
20.0000 mg | ORAL_TABLET | Freq: Two times a day (BID) | ORAL | 0 refills | Status: AC
Start: 2019-12-08 — End: 2019-12-13

## 2019-12-08 MED ORDER — DEXAMETHASONE SODIUM PHOSPHATE 10 MG/ML IJ SOLN
10.0000 mg | Freq: Once | INTRAMUSCULAR | Status: AC
  Administered 2019-12-08: 10 mg via INTRAMUSCULAR

## 2019-12-08 NOTE — ED Triage Notes (Addendum)
Neck pain to LT side of neck with movement x over a week . Denies any injury.  Has tried ibuprofen with minor relief.

## 2019-12-08 NOTE — Discharge Instructions (Signed)
Steroid shot given in office Continue conservative management of rest, ice, and gentle stretches Prednisone prescribed.  Take as directed and to completion Take zanaflex at nighttime for symptomatic relief. Avoid driving or operating heavy machinery while using medication. Follow up with orthopedists for further evaluation and management if symptoms persists or worsen Return or go to the ER if you have any new or worsening symptoms (fever, chills, chest pain, redness, swelling, headache, vision changes, persistent symptoms despite treatment, etc...)

## 2019-12-08 NOTE — ED Provider Notes (Signed)
[facility]   [id number] 12/08/19 Arrival Time: 1534  CC: neck PAIN  SUBJECTIVE: History from: patient. Morgan Hill is a 74 y.o. female complains of LT sided neck pain x 1 week.  Denies a precipitating event or specific injury.  Localizes the pain to the LT side of neck.  Describes the pain as intermittent, sharp, dull, throbbing and achy in character.  Has tried OTC medications without relief.  Symptoms are made worse during the day.  Denies similar symptoms in the past.  Denies fever, chills, erythema, ecchymosis, effusion, weakness, numbness and tingling.  ROS: As per HPI.  All other pertinent ROS negative.     Past Medical History:  Diagnosis Date   Back pain    CTS (carpal tunnel syndrome)    right   GERD (gastroesophageal reflux disease)    History reviewed. No pertinent surgical history. No Known Allergies No current facility-administered medications on file prior to encounter.   Current Outpatient Medications on File Prior to Encounter  Medication Sig Dispense Refill   citalopram (CELEXA) 10 MG tablet Take 1 tablet (10 mg total) by mouth daily. 90 tablet 3   OVER THE COUNTER MEDICATION Vit d 3 2000 iu daily magnessium     pantoprazole (PROTONIX) 40 MG tablet Take 1 tablet (40 mg total) by mouth daily. 90 tablet 3   Propylene Glycol (SYSTANE BALANCE OP) Apply 1 drop to eye in the morning, at noon, and at bedtime. Dry eye     Social History   Socioeconomic History   Marital status: Single    Spouse name: Not on file   Number of children: Not on file   Years of education: Not on file   Highest education level: Not on file  Occupational History   Not on file  Tobacco Use   Smoking status: Former Smoker    Types: Cigarettes    Quit date: 02/24/1995    Years since quitting: 24.8   Smokeless tobacco: Former Neurosurgeon    Quit date: 01/11/1996  Vaping Use   Vaping Use: Never used  Substance and Sexual Activity   Alcohol use: Yes     Alcohol/week: 1.0 standard drink    Types: 1 Glasses of wine per week    Comment: occasional   Drug use: No   Sexual activity: Not on file  Other Topics Concern   Not on file  Social History Narrative   Not on file   Social Determinants of Health   Financial Resource Strain:    Difficulty of Paying Living Expenses: Not on file  Food Insecurity:    Worried About Running Out of Food in the Last Year: Not on file   Ran Out of Food in the Last Year: Not on file  Transportation Needs:    Lack of Transportation (Medical): Not on file   Lack of Transportation (Non-Medical): Not on file  Physical Activity:    Days of Exercise per Week: Not on file   Minutes of Exercise per Session: Not on file  Stress:    Feeling of Stress : Not on file  Social Connections:    Frequency of Communication with Friends and Family: Not on file   Frequency of Social Gatherings with Friends and Family: Not on file   Attends Religious Services: Not on file   Active Member of Clubs or Organizations: Not on file   Attends Banker Meetings: Not on file   Marital Status: Not on file  Intimate Partner Violence:  Fear of Current or Ex-Partner: Not on file   Emotionally Abused: Not on file   Physically Abused: Not on file   Sexually Abused: Not on file   Family History  Problem Relation Age of Onset   Hypertension Mother    Diabetes Mother    Heart disease Mother     OBJECTIVE:  Vitals:   12/08/19 1653 12/08/19 1705  BP: (!) 184/77   Pulse: 75   Resp: 17   Temp: 97.8 F (36.6 C)   TempSrc: Tympanic   SpO2: 96%   Weight:  200 lb (90.7 kg)  Height:  5\' 4"  (1.626 m)    General appearance: ALERT; in no acute distress.  Head: NCAT Lungs: Normal respiratory effort Musculoskeletal: Neck Inspection: Skin warm, dry, clear and intact without obvious erythema, effusion, or ecchymosis.  Palpation: TTP over LT side of neck, along base of skull, no palpable mass, or  lump ROM: FROM active and passive Strength: 5/5 shld abduction, 5/5 shld adduction, 5/5 elbow flexion, 5/5 elbow extension, 5/5 grip strength Skin: warm and dry Neurologic: Ambulates without difficulty Psychological: alert and cooperative; normal mood and affect  ASSESSMENT & PLAN:  1. Neck pain     Meds ordered this encounter  Medications   predniSONE (DELTASONE) 20 MG tablet    Sig: Take 1 tablet (20 mg total) by mouth 2 (two) times daily with a meal for 5 days.    Dispense:  10 tablet    Refill:  0    Order Specific Question:   Supervising Provider    Answer:   Eustace Moore   tizanidine (ZANAFLEX) 2 MG capsule    Sig: Take 1 capsule (2 mg total) by mouth at bedtime as needed for muscle spasms.    Dispense:  15 capsule    Refill:  0    Order Specific Question:   Supervising Provider    Answer:   [[id number]] Eustace Moore   dexamethasone (DECADRON) injection 10 mg   Steroid shot given in office Continue conservative management of rest, ice, and gentle stretches Prednisone prescribed.  Take as directed and to completion Take zanaflex at nighttime for symptomatic relief. Avoid driving or operating heavy machinery while using medication. Follow up with orthopedists for further evaluation and management if symptoms persists or worsen Return or go to the ER if you have any new or worsening symptoms (fever, chills, chest pain, redness, swelling, headache, vision changes, persistent symptoms despite treatment, etc...)    Reviewed expectations re: course of current medical issues. Questions answered. Outlined signs and symptoms indicating need for more acute intervention. Patient verbalized understanding. After Visit Summary given.    [[id number]], PA-C 12/08/19 1724

## 2020-01-09 ENCOUNTER — Ambulatory Visit (HOSPITAL_COMMUNITY)
Admission: RE | Admit: 2020-01-09 | Discharge: 2020-01-09 | Disposition: A | Discharge status: Home / Self Care | Payer: Medicare HMO | Source: Ambulatory Visit | Attending: Family Medicine | Admitting: Family Medicine

## 2020-01-09 ENCOUNTER — Ambulatory Visit (INDEPENDENT_AMBULATORY_CARE_PROVIDER_SITE_OTHER): Payer: Medicare HMO | Admitting: Family Medicine

## 2020-01-09 ENCOUNTER — Other Ambulatory Visit: Payer: Self-pay

## 2020-01-09 ENCOUNTER — Encounter: Payer: Self-pay | Admitting: Family Medicine

## 2020-01-09 ENCOUNTER — Telehealth: Payer: Self-pay | Admitting: Family Medicine

## 2020-01-09 VITALS — BP 118/74 | HR 102 | Temp 96.6°F | Ht 64.0 in | Wt 201.2 lb

## 2020-01-09 DIAGNOSIS — Z13220 Encounter for screening for lipoid disorders: Secondary | ICD-10-CM | POA: Diagnosis not present

## 2020-01-09 DIAGNOSIS — H81399 Other peripheral vertigo, unspecified ear: Secondary | ICD-10-CM

## 2020-01-09 DIAGNOSIS — R42 Dizziness and giddiness: Secondary | ICD-10-CM | POA: Diagnosis not present

## 2020-01-09 DIAGNOSIS — R2689 Other abnormalities of gait and mobility: Secondary | ICD-10-CM

## 2020-01-09 DIAGNOSIS — R7303 Prediabetes: Secondary | ICD-10-CM

## 2020-01-09 DIAGNOSIS — G9389 Other specified disorders of brain: Secondary | ICD-10-CM | POA: Diagnosis not present

## 2020-01-09 DIAGNOSIS — R2681 Unsteadiness on feet: Secondary | ICD-10-CM | POA: Insufficient documentation

## 2020-01-09 LAB — POCT GLUCOSE (DEVICE FOR HOME USE): Glucose Fasting, POC: 132 mg/dL — AB (ref 70–99)

## 2020-01-09 MED ORDER — MECLIZINE HCL 25 MG PO TABS: 25.0000 mg | ORAL_TABLET | Freq: Three times a day (TID) | ORAL | 1 refills | Status: DC | PRN

## 2020-01-09 NOTE — Progress Notes (Signed)
Patient ID: Morgan Hill, female    DOB: 12-06-1945, 74 y.o.   MRN: [id number]   Chief Complaint  Patient presents with  . Prediabetes   Subjective:    HPI Pt here for blood sugar follow up. Pt states she has been experiencing nausea, head pressure/heavy feeling in head, buzzing in ear, tingling on right side and feeling unbalanced.  Pt just seen in urgent care on 12/08/19 for left sided neck pain.  Was given decadron injection, prednisone, and zanaflex.   Glucose in office 132.  Was fasting. Only water this am. Last a1c in 4/21- 5.5. In 2019- a1c at 6.3. Worked on her diet to bring down the glucose. Started walking more.  H/o hld- not on meds.  In summer had similar symptoms. Now since 2 wks ago, having  Was treated with abx in 10/23/19 and for sinusitis and vertigo.  Pt seen in ER for neck pain and seen in urgent 12/08/19. Was given prednisone and zanaflex.  Was given steroid shot and it improved.  Some days having pressure in back of head and then lightheaded.  Intermittent. 1-2 days going on then resolves.  Then 1 wk gone. Feeling some vertigo and balance. Feeling this in morning and nausea and pt went to conference event, feeling like needing to sit down. Felt warm and mask on, then went outside.  When got up having off balance feeling. Feeling better outside refresh air.  Took meclizine this am, and feeling nausea has settled down some. Not wanting to move her head.  When walking feeling sensation of being pulled to right at times. If moving her head and laying down and moving quick then feeling a room spinning a few sec. Pt didn't get the head ct due to they wanted her to go to [place] for image.   Medical History Morgan Hill has a past medical history of Back pain, CTS (carpal tunnel syndrome), and GERD (gastroesophageal reflux disease).   Outpatient Encounter Medications as of 01/09/2020  Medication Sig  . citalopram (CELEXA) 10 MG tablet Take 1 tablet (10  mg total) by mouth daily.  Marland Kitchen OVER THE COUNTER MEDICATION Vit d 3 2000 iu daily magnessium  . pantoprazole (PROTONIX) 40 MG tablet Take 1 tablet (40 mg total) by mouth daily.  Marland Kitchen Propylene Glycol (SYSTANE BALANCE OP) Apply 1 drop to eye in the morning, at noon, and at bedtime. Dry eye  . tizanidine (ZANAFLEX) 2 MG capsule Take 1 capsule (2 mg total) by mouth at bedtime as needed for muscle spasms.  . meclizine (ANTIVERT) 25 MG tablet Take 1 tablet (25 mg total) by mouth 3 (three) times daily as needed for dizziness.   No facility-administered encounter medications on file as of 01/09/2020.     Review of Systems  Constitutional: Negative for chills and fever.  HENT: Positive for tinnitus. Negative for congestion, rhinorrhea and sore throat.   Respiratory: Negative for cough, shortness of breath and wheezing.   Cardiovascular: Negative for chest pain and leg swelling.  Gastrointestinal: Positive for nausea. Negative for abdominal pain, diarrhea and vomiting.  Genitourinary: Negative for dysuria and frequency.  Musculoskeletal: Negative for arthralgias and back pain.  Skin: Negative for rash.  Neurological: Positive for light-headedness and headaches. Negative for dizziness and weakness.       +unsteady gait, tingling on rt side     Vitals BP 118/74   Pulse (!) 102   Temp (!) 96.6 F (35.9 C)   Ht 5' 4"  (1.626 m)  Wt 201 lb 3.2 oz (91.3 kg)   SpO2 98%   BMI 34.54 kg/m   Objective:   Physical Exam Vitals and nursing note reviewed.  Constitutional:      General: She is not in acute distress.    Appearance: Normal appearance. She is not ill-appearing, toxic-appearing or diaphoretic.  HENT:     Head: Normocephalic and atraumatic.     Right Ear: Tympanic membrane normal.     Left Ear: Tympanic membrane normal.     Nose: Nose normal.     Mouth/Throat:     Mouth: Mucous membranes are moist.     Pharynx: Oropharynx is clear.  Eyes:     Extraocular Movements: Extraocular  movements intact.     Conjunctiva/sclera: Conjunctivae normal.     Pupils: Pupils are equal, round, and reactive to light.  Cardiovascular:     Rate and Rhythm: Normal rate and regular rhythm.     Pulses: Normal pulses.     Heart sounds: Normal heart sounds.  Pulmonary:     Effort: Pulmonary effort is normal. No respiratory distress.     Breath sounds: Normal breath sounds. No wheezing, rhonchi or rales.  Musculoskeletal:        General: Normal range of motion.     Right lower leg: No edema.     Left lower leg: No edema.  Skin:    General: Skin is warm and dry.     Findings: No lesion or rash.  Neurological:     General: No focal deficit present.     Mental Status: She is alert and oriented to person, place, and time.     Cranial Nerves: No cranial nerve deficit.     Sensory: No sensory deficit.     Motor: No weakness.     Gait: Gait normal.  Psychiatric:        Mood and Affect: Mood normal.        Behavior: Behavior normal.        Thought Content: Thought content normal.        Judgment: Judgment normal.      Assessment and Plan   1. Vertigo - Ambulatory referral to ENT - B12  2. Prediabetes - POCT Glucose (Device for Home Use) - CMP14+EGFR - Hemoglobin A1c  3. Lipid screening - Lipid panel  4. Dizziness - Ambulatory referral to ENT - B12  5. Loss of balance - B12  6. Other peripheral vertigo, unspecified ear - CT Head Wo Contrast   Pt to get head CT stat,  And labs.  Pt to take meclazine prn in the meantime. If all negative, would refer to ENT for concerns of vertigo.  F/u prn.

## 2020-01-09 NOTE — Telephone Encounter (Signed)
Pt contacted and verbalized understanding.  

## 2020-01-10 ENCOUNTER — Encounter: Payer: Self-pay | Admitting: Family Medicine

## 2020-01-10 LAB — HEMOGLOBIN A1C
Est. average glucose Bld gHb Est-mCnc: 143 mg/dL
Hgb A1c MFr Bld: 6.6 % — ABNORMAL HIGH (ref 4.8–5.6)

## 2020-01-10 LAB — CMP14+EGFR
ALT: 23 IU/L (ref 0–32)
AST: 17 IU/L (ref 0–40)
Albumin/Globulin Ratio: 1.5 (ref 1.2–2.2)
Albumin: 5 g/dL — ABNORMAL HIGH (ref 3.7–4.7)
Alkaline Phosphatase: 72 IU/L (ref 44–121)
BUN/Creatinine Ratio: 14 (ref 12–28)
BUN: 12 mg/dL (ref 8–27)
Bilirubin Total: 0.5 mg/dL (ref 0.0–1.2)
CO2: 22 mmol/L (ref 20–29)
Calcium: 9.6 mg/dL (ref 8.7–10.3)
Chloride: 103 mmol/L (ref 96–106)
Creatinine, Ser: 0.84 mg/dL (ref 0.57–1.00)
GFR calc Af Amer: 80 mL/min/{1.73_m2} (ref >59)
GFR calc non Af Amer: 69 mL/min/{1.73_m2} (ref >59)
Globulin, Total: 3.3 g/dL (ref 1.5–4.5)
Glucose: 131 mg/dL — ABNORMAL HIGH (ref 65–99)
Potassium: 4.9 mmol/L (ref 3.5–5.2)
Sodium: 140 mmol/L (ref 134–144)
Total Protein: 8.3 g/dL (ref 6.0–8.5)

## 2020-01-10 LAB — LIPID PANEL
Chol/HDL Ratio: 3.2 ratio (ref 0.0–4.4)
Cholesterol, Total: 251 mg/dL — ABNORMAL HIGH (ref 100–199)
HDL: 79 mg/dL (ref >39)
LDL Chol Calc (NIH): 159 mg/dL — ABNORMAL HIGH (ref 0–99)
Triglycerides: 79 mg/dL (ref 0–149)
VLDL Cholesterol Cal: 13 mg/dL (ref 5–40)

## 2020-01-10 LAB — VITAMIN B12: Vitamin B-12: 859 pg/mL (ref 232–1245)

## 2020-01-30 DIAGNOSIS — R69 Illness, unspecified: Secondary | ICD-10-CM | POA: Diagnosis not present

## 2020-03-28 ENCOUNTER — Ambulatory Visit
Admission: EM | Admit: 2020-03-28 | Discharge: 2020-03-28 | Disposition: A | Discharge status: Home / Self Care | Payer: Medicare HMO | Attending: Emergency Medicine | Admitting: Emergency Medicine

## 2020-03-28 ENCOUNTER — Encounter: Payer: Self-pay | Admitting: Emergency Medicine

## 2020-03-28 ENCOUNTER — Other Ambulatory Visit: Payer: Self-pay

## 2020-03-28 DIAGNOSIS — J029 Acute pharyngitis, unspecified: Secondary | ICD-10-CM | POA: Diagnosis not present

## 2020-03-28 DIAGNOSIS — Z1152 Encounter for screening for COVID-19: Secondary | ICD-10-CM | POA: Insufficient documentation

## 2020-03-28 DIAGNOSIS — Z7689 Persons encountering health services in other specified circumstances: Secondary | ICD-10-CM | POA: Diagnosis not present

## 2020-03-28 LAB — POCT RAPID STREP A (OFFICE): Rapid Strep A Screen: NEGATIVE

## 2020-03-28 MED ORDER — LIDOCAINE VISCOUS HCL 2 % MT SOLN
15.0000 mL | OROMUCOSAL | 0 refills | Status: DC | PRN
Start: 2020-03-28 — End: 2020-07-17

## 2020-03-28 MED ORDER — AMOXICILLIN 500 MG PO CAPS
500.0000 mg | ORAL_CAPSULE | Freq: Two times a day (BID) | ORAL | 0 refills | Status: AC
Start: 2020-03-28 — End: 2020-04-04

## 2020-03-28 NOTE — ED Provider Notes (Addendum)
[facility]   [id number] 03/28/20 Arrival Time: 1531   Chief Complaint  Patient presents with  . Sore Throat     SUBJECTIVE: History from: patient.  Morgan Hill is a 75 y.o. female who presented to the urgent care with a complaint of sore throat for the past 4 days .  Denies sick exposure to COVID, flu or strep.  Denies recent travel.  Has tried OTC medication without relief. Denies of any aggravating factors. Denies previous symptoms in the past.   Denies fever, chills, fatigue, sinus pain, rhinorrhea, SOB, wheezing, chest pain, nausea, changes in bowel or bladder habits.     ROS: As per HPI.  All other pertinent ROS negative.     Past Medical History:  Diagnosis Date  . Back pain   . CTS (carpal tunnel syndrome)    right  . GERD (gastroesophageal reflux disease)    History reviewed. No pertinent surgical history. No Known Allergies No current facility-administered medications on file prior to encounter.   Current Outpatient Medications on File Prior to Encounter  Medication Sig Dispense Refill  . citalopram (CELEXA) 10 MG tablet Take 1 tablet (10 mg total) by mouth daily. 90 tablet 3  . meclizine (ANTIVERT) 25 MG tablet Take 1 tablet (25 mg total) by mouth 3 (three) times daily as needed for dizziness. 30 tablet 1  . OVER THE COUNTER MEDICATION Vit d 3 2000 iu daily magnessium    . pantoprazole (PROTONIX) 40 MG tablet Take 1 tablet (40 mg total) by mouth daily. 90 tablet 3  . Propylene Glycol (SYSTANE BALANCE OP) Apply 1 drop to eye in the morning, at noon, and at bedtime. Dry eye    . tizanidine (ZANAFLEX) 2 MG capsule Take 1 capsule (2 mg total) by mouth at bedtime as needed for muscle spasms. 15 capsule 0   Social History   Socioeconomic History  . Marital status: Single    Spouse name: Not on file  . Number of children: Not on file  . Years of education: Not on file  . Highest education level: Not on file  Occupational History  . Not on file   Tobacco Use  . Smoking status: Former Smoker    Types: Cigarettes    Quit date: 02/24/1995    Years since quitting: 25.1  . Smokeless tobacco: Former Neurosurgeon    Quit date: 01/11/1996  Vaping Use  . Vaping Use: Never used  Substance and Sexual Activity  . Alcohol use: Yes    Alcohol/week: 1.0 standard drink    Types: 1 Glasses of wine per week    Comment: occasional  . Drug use: No  . Sexual activity: Not on file  Other Topics Concern  . Not on file  Social History Narrative  . Not on file   Social Determinants of Health   Financial Resource Strain: Not on file  Food Insecurity: Not on file  Transportation Needs: Not on file  Physical Activity: Not on file  Stress: Not on file  Social Connections: Not on file  Intimate Partner Violence: Not on file   Family History  Problem Relation Age of Onset  . Hypertension Mother   . Diabetes Mother   . Heart disease Mother     OBJECTIVE:  Vitals:   03/28/20 1540 03/28/20 1541  BP:  (!) 145/81  Pulse:  75  Resp:  18  Temp:  98.5 F (36.9 C)  TempSrc:  Oral  SpO2:  96%  Weight: 198  lb (89.8 kg)   Height: 5\' 4"  (1.626 m)      General appearance: alert; appears fatigued, but nontoxic; speaking in full sentences and tolerating own secretions HEENT: NCAT; Ears: EACs clear, TMs pearly gray; Eyes: PERRL.  EOM grossly intact. Sinuses: nontender; Nose: nares patent without rhinorrhea, Throat: oropharynx clear, tonsils non erythematous or enlarged, uvula midline  Neck: supple without LAD Lungs: unlabored respirations, symmetrical air entry; cough: absent; no respiratory distress; CTAB Heart: regular rate and rhythm.  Radial pulses 2+ symmetrical bilaterally Skin: warm and dry Psychological: alert and cooperative; normal mood and affect  LABS:  Results for orders placed or performed during the hospital encounter of 03/28/20 (from the past 24 hour(s))  POCT rapid strep A     Status: None   Collection Time: 03/28/20  4:02 PM   Result Value Ref Range   Rapid Strep A Screen Negative Negative     ASSESSMENT & PLAN:  1. Sore throat   2. Encounter for screening for COVID-19     Meds ordered this encounter  Medications  . lidocaine (XYLOCAINE) 2 % solution    Sig: Use as directed 15 mLs in the mouth or throat as needed for mouth pain.    Dispense:  100 mL    Refill:  0  . amoxicillin (AMOXIL) 500 MG capsule    Sig: Take 1 capsule (500 mg total) by mouth 2 (two) times daily for 7 days.    Dispense:  14 capsule    Refill:  0   Patient is stable at discharge.  Her symptom does not appear bacterial.  Patient states she always get antibiotic from PCP if she is having this sore throat.  She requests amoxicillin to be prescribed.  Discharge Instructions    COVID testing ordered.  It will take between 2-7 days for test results.  Someone will contact you regarding abnormal results.    Get plenty of rest and push fluids Throat lozenges such as Halls, Cepacol or Vicks to soothe throat Gargle with salty warm water Viscous lidocaine prescribed.  This is an oral solution you can swish, and gargle as needed for symptomatic relief of sore throat.  Do not exceed 8 doses in a 24 hour period.  Do not use prior to eating, as this will numb your entire mouth.  Use medications daily for symptom relief Use OTC medications like ibuprofen or tylenol as needed fever or pain Call or go to the ED if you have any new or worsening symptoms such as fever, worsening cough, shortness of breath, chest tightness, chest pain, turning blue, changes in mental status, etc...   Reviewed expectations re: course of current medical issues. Questions answered. Outlined signs and symptoms indicating need for more acute intervention. Patient verbalized understanding. After Visit Summary given.         05/26/20, FNP 03/28/20 1627    05/26/20, FNP 03/28/20 1627

## 2020-03-28 NOTE — ED Triage Notes (Signed)
Sore throat since Sunday. States it feels better today but still there.  States she usually has to get abx to get better if it goes on this long.

## 2020-03-28 NOTE — Discharge Instructions (Signed)
COVID testing ordered.  It will take between 2-7 days for test results.  Someone will contact you regarding abnormal results.    Get plenty of rest and push fluids Throat lozenges such as Halls, Cepacol or Vicks to soothe throat Gargle with salty warm water Viscous lidocaine prescribed.  This is an oral solution you can swish, and gargle as needed for symptomatic relief of sore throat.  Do not exceed 8 doses in a 24 hour period.  Do not use prior to eating, as this will numb your entire mouth.  Use medications daily for symptom relief Use OTC medications like ibuprofen or tylenol as needed fever or pain Call or go to the ED if you have any new or worsening symptoms such as fever, worsening cough, shortness of breath, chest tightness, chest pain, turning blue, changes in mental status, etc..Marland Kitchen

## 2020-03-29 LAB — NOVEL CORONAVIRUS, NAA: SARS-CoV-2, NAA: NOT DETECTED

## 2020-03-29 LAB — SARS-COV-2, NAA 2 DAY TAT

## 2020-03-31 LAB — CULTURE, GROUP A STREP (THRC)

## 2020-04-19 ENCOUNTER — Other Ambulatory Visit: Payer: Self-pay

## 2020-04-19 ENCOUNTER — Ambulatory Visit (INDEPENDENT_AMBULATORY_CARE_PROVIDER_SITE_OTHER): Payer: Medicare HMO | Admitting: Family Medicine

## 2020-04-19 VITALS — BP 138/84 | HR 90 | Temp 97.4°F | Ht 64.0 in | Wt 200.4 lb

## 2020-04-19 DIAGNOSIS — K219 Gastro-esophageal reflux disease without esophagitis: Secondary | ICD-10-CM

## 2020-04-19 DIAGNOSIS — H6521 Chronic serous otitis media, right ear: Secondary | ICD-10-CM | POA: Diagnosis not present

## 2020-04-19 DIAGNOSIS — R7989 Other specified abnormal findings of blood chemistry: Secondary | ICD-10-CM

## 2020-04-19 DIAGNOSIS — E119 Type 2 diabetes mellitus without complications: Secondary | ICD-10-CM | POA: Diagnosis not present

## 2020-04-19 MED ORDER — PANTOPRAZOLE SODIUM 40 MG PO TBEC: 40.0000 mg | DELAYED_RELEASE_TABLET | Freq: Every day | ORAL | 3 refills | Status: DC

## 2020-04-19 MED ORDER — CITALOPRAM HYDROBROMIDE 10 MG PO TABS: 10.0000 mg | ORAL_TABLET | Freq: Every day | ORAL | 3 refills | Status: DC

## 2020-04-19 MED ORDER — FLUTICASONE PROPIONATE 50 MCG/ACT NA SUSP: 2.0000 | Freq: Every day | NASAL | 1 refills | Status: DC

## 2020-04-19 NOTE — Progress Notes (Signed)
Patient ID: Morgan Hill, female    DOB: 1945-08-16, 75 y.o.   MRN: [id number]   Chief Complaint  Patient presents with  . Prediabetes  . Hyperglycemia    Follow up   Subjective:    HPI   H/o prediabetes- f/u on labs.   Has been dec carbs and sweets after Christmas. No inc in thirst or urination.   Vertigo- not as frequent.  had a few days was taking antivert prn.  Feeling fullness and feeling something in her ear. Never had any issues with it.  Has h/o allergies.   Medical History Morgan Hill has a past medical history of Back pain, CTS (carpal tunnel syndrome), and GERD (gastroesophageal reflux disease).   Outpatient Encounter Medications as of 04/19/2020  Medication Sig  . fluticasone (FLONASE) 50 MCG/ACT nasal spray Place 2 sprays into both nostrils daily.  . citalopram (CELEXA) 10 MG tablet Take 1 tablet (10 mg total) by mouth daily.  Marland Kitchen lidocaine (XYLOCAINE) 2 % solution Use as directed 15 mLs in the mouth or throat as needed for mouth pain.  . meclizine (ANTIVERT) 25 MG tablet Take 1 tablet (25 mg total) by mouth 3 (three) times daily as needed for dizziness.  Marland Kitchen OVER THE COUNTER MEDICATION Vit d 3 2000 iu daily magnessium  . pantoprazole (PROTONIX) 40 MG tablet Take 1 tablet (40 mg total) by mouth daily.  Marland Kitchen Propylene Glycol (SYSTANE BALANCE OP) Apply 1 drop to eye in the morning, at noon, and at bedtime. Dry eye  . tizanidine (ZANAFLEX) 2 MG capsule Take 1 capsule (2 mg total) by mouth at bedtime as needed for muscle spasms.  . [DISCONTINUED] citalopram (CELEXA) 10 MG tablet Take 1 tablet (10 mg total) by mouth daily.  . [DISCONTINUED] pantoprazole (PROTONIX) 40 MG tablet Take 1 tablet (40 mg total) by mouth daily.   No facility-administered encounter medications on file as of 04/19/2020.     Review of Systems  Constitutional: Negative for chills and fever.  HENT: Negative for congestion, rhinorrhea and sore throat.   Respiratory: Negative for cough, shortness of  breath and wheezing.   Cardiovascular: Negative for chest pain and leg swelling.  Gastrointestinal: Negative for abdominal pain, diarrhea, nausea and vomiting.  Genitourinary: Negative for dysuria and frequency.  Musculoskeletal: Negative for arthralgias and back pain.  Skin: Negative for rash.  Neurological: Negative for dizziness, weakness and headaches.     Vitals BP 138/84   Pulse 90   Temp (!) 97.4 F (36.3 C) (Oral)   Ht 5' 4"  (1.626 m)   Wt 200 lb 6.4 oz (90.9 kg)   SpO2 98%   BMI 34.40 kg/m   Objective:   Physical Exam Vitals and nursing note reviewed.  Constitutional:      Appearance: Normal appearance.  HENT:     Head: Normocephalic and atraumatic.     Nose: Nose normal.     Mouth/Throat:     Mouth: Mucous membranes are moist.     Pharynx: Oropharynx is clear.  Eyes:     Extraocular Movements: Extraocular movements intact.     Conjunctiva/sclera: Conjunctivae normal.     Pupils: Pupils are equal, round, and reactive to light.  Cardiovascular:     Rate and Rhythm: Normal rate and regular rhythm.     Pulses: Normal pulses.     Heart sounds: Normal heart sounds.  Pulmonary:     Effort: Pulmonary effort is normal.     Breath sounds: Normal breath sounds. No wheezing,  rhonchi or rales.  Musculoskeletal:        General: Normal range of motion.     Right lower leg: No edema.     Left lower leg: No edema.  Skin:    General: Skin is warm and dry.     Findings: No lesion or rash.  Neurological:     General: No focal deficit present.     Mental Status: She is alert and oriented to person, place, and time.  Psychiatric:        Mood and Affect: Mood normal.        Behavior: Behavior normal.      Assessment and Plan   1. Type 2 diabetes mellitus without complication, without long-term current use of insulin (HCC) - CBC - CMP14+EGFR - Hemoglobin A1c - Lipid panel  2. Elevated TSH - TSH  3. Right chronic serous otitis media - fluticasone (FLONASE) 50  MCG/ACT nasal spray; Place 2 sprays into both nostrils daily.  Dispense: 16 g; Refill: 1  4. Gastroesophageal reflux disease, unspecified whether esophagitis present   gerd- stable. Cont meds.  Prediabetes- will recheck labs. Cont to dec carbs.  Ordered labs today.  HLD- will recheck labs.   Last labs- a1c 6.6.  Cholesterol elevated on last labs.  Pt stating has been working on [employer] in exercising.  Call with results.   F/u 41mo

## 2020-04-20 LAB — CMP14+EGFR
ALT: 21 IU/L (ref 0–32)
AST: 20 IU/L (ref 0–40)
Albumin/Globulin Ratio: 1.4 (ref 1.2–2.2)
Albumin: 4.6 g/dL (ref 3.7–4.7)
Alkaline Phosphatase: 53 IU/L (ref 44–121)
BUN/Creatinine Ratio: 12 (ref 12–28)
BUN: 11 mg/dL (ref 8–27)
Bilirubin Total: 0.7 mg/dL (ref 0.0–1.2)
CO2: 22 mmol/L (ref 20–29)
Calcium: 9.3 mg/dL (ref 8.7–10.3)
Chloride: 101 mmol/L (ref 96–106)
Creatinine, Ser: 0.92 mg/dL (ref 0.57–1.00)
GFR calc Af Amer: 71 mL/min/{1.73_m2} (ref >59)
GFR calc non Af Amer: 62 mL/min/{1.73_m2} (ref >59)
Globulin, Total: 3.3 g/dL (ref 1.5–4.5)
Glucose: 109 mg/dL — ABNORMAL HIGH (ref 65–99)
Potassium: 4.5 mmol/L (ref 3.5–5.2)
Sodium: 138 mmol/L (ref 134–144)
Total Protein: 7.9 g/dL (ref 6.0–8.5)

## 2020-04-20 LAB — CBC
Hematocrit: 42.5 % (ref 34.0–46.6)
Hemoglobin: 14 g/dL (ref 11.1–15.9)
MCH: 28.8 pg (ref 26.6–33.0)
MCHC: 32.9 g/dL (ref 31.5–35.7)
MCV: 87 fL (ref 79–97)
Platelets: 189 10*3/uL (ref 150–450)
RBC: 4.86 x10E6/uL (ref 3.77–5.28)
RDW: 12.3 % (ref 11.7–15.4)
WBC: 4.9 10*3/uL (ref 3.4–10.8)

## 2020-04-20 LAB — LIPID PANEL
Chol/HDL Ratio: 3.2 ratio (ref 0.0–4.4)
Cholesterol, Total: 230 mg/dL — ABNORMAL HIGH (ref 100–199)
HDL: 72 mg/dL (ref >39)
LDL Chol Calc (NIH): 144 mg/dL — ABNORMAL HIGH (ref 0–99)
Triglycerides: 82 mg/dL (ref 0–149)
VLDL Cholesterol Cal: 14 mg/dL (ref 5–40)

## 2020-04-20 LAB — HEMOGLOBIN A1C
Est. average glucose Bld gHb Est-mCnc: 140 mg/dL
Hgb A1c MFr Bld: 6.5 % — ABNORMAL HIGH (ref 4.8–5.6)

## 2020-04-20 LAB — TSH: TSH: 6.79 u[IU]/mL — ABNORMAL HIGH (ref 0.450–4.500)

## 2020-05-11 ENCOUNTER — Other Ambulatory Visit: Payer: Self-pay | Admitting: Family Medicine

## 2020-05-11 DIAGNOSIS — H6521 Chronic serous otitis media, right ear: Secondary | ICD-10-CM

## 2020-06-05 ENCOUNTER — Other Ambulatory Visit: Payer: Self-pay | Admitting: Family Medicine

## 2020-06-05 DIAGNOSIS — H6521 Chronic serous otitis media, right ear: Secondary | ICD-10-CM

## 2020-06-28 ENCOUNTER — Other Ambulatory Visit: Payer: Self-pay | Admitting: Family Medicine

## 2020-06-28 DIAGNOSIS — H6521 Chronic serous otitis media, right ear: Secondary | ICD-10-CM

## 2020-06-29 ENCOUNTER — Other Ambulatory Visit: Payer: Self-pay | Admitting: Family Medicine

## 2020-06-29 DIAGNOSIS — H6521 Chronic serous otitis media, right ear: Secondary | ICD-10-CM

## 2020-07-17 ENCOUNTER — Telehealth: Payer: Self-pay | Admitting: Family Medicine

## 2020-07-17 ENCOUNTER — Other Ambulatory Visit: Payer: Self-pay

## 2020-07-17 ENCOUNTER — Ambulatory Visit (INDEPENDENT_AMBULATORY_CARE_PROVIDER_SITE_OTHER): Payer: Medicare HMO | Admitting: Family Medicine

## 2020-07-17 VITALS — BP 138/74 | HR 69 | Temp 97.3°F | Ht 64.0 in | Wt 200.0 lb

## 2020-07-17 DIAGNOSIS — F321 Major depressive disorder, single episode, moderate: Secondary | ICD-10-CM | POA: Diagnosis not present

## 2020-07-17 DIAGNOSIS — K219 Gastro-esophageal reflux disease without esophagitis: Secondary | ICD-10-CM | POA: Diagnosis not present

## 2020-07-17 DIAGNOSIS — R7989 Other specified abnormal findings of blood chemistry: Secondary | ICD-10-CM | POA: Diagnosis not present

## 2020-07-17 DIAGNOSIS — E119 Type 2 diabetes mellitus without complications: Secondary | ICD-10-CM

## 2020-07-17 DIAGNOSIS — R69 Illness, unspecified: Secondary | ICD-10-CM | POA: Diagnosis not present

## 2020-07-17 NOTE — Telephone Encounter (Signed)
Called pt and she verbalized understanding. Order printed out for pt to take to lab to just do bw for thyroid and other bw is due in august. Pt verbalized understanding.

## 2020-07-17 NOTE — Patient Instructions (Signed)
https://www.nhlbi.nih.gov/files/docs/public/heart/dash_brief.pdf">  DASH Eating Plan DASH stands for Dietary Approaches to Stop Hypertension. The DASH eating plan is a healthy eating plan that has been shown to:  Reduce high blood pressure (hypertension).  Reduce your risk for type 2 diabetes, heart disease, and stroke.  Help with weight loss. What are tips for following this plan? Reading food labels  Check food labels for the amount of salt (sodium) per serving. Choose foods with less than 5 percent of the Daily Value of sodium. Generally, foods with less than 300 milligrams (mg) of sodium per serving fit into this eating plan.  To find whole grains, look for the word "whole" as the first word in the ingredient list. Shopping  Buy products labeled as "low-sodium" or "no salt added."  Buy fresh foods. Avoid canned foods and pre-made or frozen meals. Cooking  Avoid adding salt when cooking. Use salt-free seasonings or herbs instead of table salt or sea salt. Check with your health care provider or pharmacist before using salt substitutes.  Do not fry foods. Cook foods using healthy methods such as baking, boiling, grilling, roasting, and broiling instead.  Cook with heart-healthy oils, such as olive, canola, avocado, soybean, or sunflower oil. Meal planning  Eat a balanced diet that includes: ? 4 or more servings of fruits and 4 or more servings of vegetables each day. Try to fill one-half of your plate with fruits and vegetables. ? 6-8 servings of whole grains each day. ? Less than 6 oz (170 g) of lean meat, poultry, or fish each day. A 3-oz (85-g) serving of meat is about the same size as a deck of cards. One egg equals 1 oz (28 g). ? 2-3 servings of low-fat dairy each day. One serving is 1 cup (237 mL). ? 1 serving of nuts, seeds, or beans 5 times each week. ? 2-3 servings of heart-healthy fats. Healthy fats called omega-3 fatty acids are found in foods such as walnuts,  flaxseeds, fortified milks, and eggs. These fats are also found in cold-water fish, such as sardines, salmon, and mackerel.  Limit how much you eat of: ? Canned or prepackaged foods. ? Food that is high in trans fat, such as some fried foods. ? Food that is high in saturated fat, such as fatty meat. ? Desserts and other sweets, sugary drinks, and other foods with added sugar. ? Full-fat dairy products.  Do not salt foods before eating.  Do not eat more than 4 egg yolks a week.  Try to eat at least 2 vegetarian meals a week.  Eat more home-cooked food and less restaurant, buffet, and fast food.   Lifestyle  When eating at a restaurant, ask that your food be prepared with less salt or no salt, if possible.  If you drink alcohol: ? Limit how much you use to:  0-1 drink a day for women who are not pregnant.  0-2 drinks a day for men. ? Be aware of how much alcohol is in your drink. In the [place] one drink equals one 12 oz bottle of beer (355 mL), one 5 oz glass of wine (148 mL), or one 1 oz glass of hard liquor (44 mL). General information  Avoid eating more than 2,300 mg of salt a day. If you have hypertension, you may need to reduce your sodium intake to 1,500 mg a day.  Work with your health care provider to maintain a healthy body weight or to lose weight. Ask what an ideal weight is for   you.  Get at least 30 minutes of exercise that causes your heart to beat faster (aerobic exercise) most days of the week. Activities may include walking, swimming, or biking.  Work with your health care provider or dietitian to adjust your eating plan to your individual calorie needs. What foods should I eat? Fruits All fresh, dried, or frozen fruit. Canned fruit in natural juice (without added sugar). Vegetables Fresh or frozen vegetables (raw, steamed, roasted, or grilled). Low-sodium or reduced-sodium tomato and vegetable juice. Low-sodium or reduced-sodium tomato sauce and tomato paste.  Low-sodium or reduced-sodium canned vegetables. Grains Whole-grain or whole-wheat bread. Whole-grain or whole-wheat pasta. Brown rice. Oatmeal. Quinoa. Bulgur. Whole-grain and low-sodium cereals. Pita bread. Low-fat, low-sodium crackers. Whole-wheat flour tortillas. Meats and other proteins Skinless chicken or turkey. Ground chicken or turkey. Pork with fat trimmed off. Fish and seafood. Egg whites. Dried beans, peas, or lentils. Unsalted nuts, nut butters, and seeds. Unsalted canned beans. Lean cuts of beef with fat trimmed off. Low-sodium, lean precooked or cured meat, such as sausages or meat loaves. Dairy Low-fat (1%) or fat-free (skim) milk. Reduced-fat, low-fat, or fat-free cheeses. Nonfat, low-sodium ricotta or cottage cheese. Low-fat or nonfat yogurt. Low-fat, low-sodium cheese. Fats and oils Soft margarine without trans fats. Vegetable oil. Reduced-fat, low-fat, or light mayonnaise and salad dressings (reduced-sodium). Canola, safflower, olive, avocado, soybean, and sunflower oils. Avocado. Seasonings and condiments Herbs. Spices. Seasoning mixes without salt. Other foods Unsalted popcorn and pretzels. Fat-free sweets. The items listed above may not be a complete list of foods and beverages you can eat. Contact a dietitian for more information. What foods should I avoid? Fruits Canned fruit in a light or heavy syrup. Fried fruit. Fruit in cream or butter sauce. Vegetables Creamed or fried vegetables. Vegetables in a cheese sauce. Regular canned vegetables (not low-sodium or reduced-sodium). Regular canned tomato sauce and paste (not low-sodium or reduced-sodium). Regular tomato and vegetable juice (not low-sodium or reduced-sodium). Pickles. Olives. Grains Baked goods made with fat, such as croissants, muffins, or some breads. Dry pasta or rice meal packs. Meats and other proteins Fatty cuts of meat. Ribs. Fried meat. Bacon. Bologna, salami, and other precooked or cured meats, such as  sausages or meat loaves. Fat from the back of a pig (fatback). Bratwurst. Salted nuts and seeds. Canned beans with added salt. Canned or smoked fish. Whole eggs or egg yolks. Chicken or turkey with skin. Dairy Whole or 2% milk, cream, and half-and-half. Whole or full-fat cream cheese. Whole-fat or sweetened yogurt. Full-fat cheese. Nondairy creamers. Whipped toppings. Processed cheese and cheese spreads. Fats and oils Butter. Stick margarine. Lard. Shortening. Ghee. Bacon fat. Tropical oils, such as coconut, palm kernel, or palm oil. Seasonings and condiments Onion salt, garlic salt, seasoned salt, table salt, and sea salt. Worcestershire sauce. Tartar sauce. Barbecue sauce. Teriyaki sauce. Soy sauce, including reduced-sodium. Steak sauce. Canned and packaged gravies. Fish sauce. Oyster sauce. Cocktail sauce. Store-bought horseradish. Ketchup. Mustard. Meat flavorings and tenderizers. Bouillon cubes. Hot sauces. Pre-made or packaged marinades. Pre-made or packaged taco seasonings. Relishes. Regular salad dressings. Other foods Salted popcorn and pretzels. The items listed above may not be a complete list of foods and beverages you should avoid. Contact a dietitian for more information. Where to find more information  National Heart, Lung, and Blood Institute: www.nhlbi.nih.gov  American Heart Association: www.heart.org  Academy of Nutrition and Dietetics: www.eatright.org  National Kidney Foundation: www.kidney.org Summary  The DASH eating plan is a healthy eating plan that has been shown to   reduce high blood pressure (hypertension). It may also reduce your risk for type 2 diabetes, heart disease, and stroke.  When on the DASH eating plan, aim to eat more fresh fruits and vegetables, whole grains, lean proteins, low-fat dairy, and heart-healthy fats.  With the DASH eating plan, you should limit salt (sodium) intake to 2,300 mg a day. If you have hypertension, you may need to reduce your  sodium intake to 1,500 mg a day.  Work with your health care provider or dietitian to adjust your eating plan to your individual calorie needs. This information is not intended to replace advice given to you by your health care provider. Make sure you discuss any questions you have with your health care provider. Document Revised: 01/13/2019 Document Reviewed: 01/13/2019 Elsevier Patient Education  2021 Elsevier Inc.  Diabetes Mellitus and Nutrition, Adult When you have diabetes, or diabetes mellitus, it is very important to have healthy eating habits because your blood sugar (glucose) levels are greatly affected by what you eat and drink. Eating healthy foods in the right amounts, at about the same times every day, can help you:  Control your blood glucose.  Lower your risk of heart disease.  Improve your blood pressure.  Reach or maintain a healthy weight. What can affect my meal plan? Every person with diabetes is different, and each person has different needs for a meal plan. Your health care provider may recommend that you work with a dietitian to make a meal plan that is best for you. Your meal plan may vary depending on factors such as:  The calories you need.  The medicines you take.  Your weight.  Your blood glucose, blood pressure, and cholesterol levels.  Your activity level.  Other health conditions you have, such as heart or kidney disease. How do carbohydrates affect me? Carbohydrates, also called carbs, affect your blood glucose level more than any other type of food. Eating carbs naturally raises the amount of glucose in your blood. Carb counting is a method for keeping track of how many carbs you eat. Counting carbs is important to keep your blood glucose at a healthy level, especially if you use insulin or take certain oral diabetes medicines. It is important to know how many carbs you can safely have in each meal. This is different for every person. Your dietitian  can help you calculate how many carbs you should have at each meal and for each snack. How does alcohol affect me? Alcohol can cause a sudden decrease in blood glucose (hypoglycemia), especially if you use insulin or take certain oral diabetes medicines. Hypoglycemia can be a life-threatening condition. Symptoms of hypoglycemia, such as sleepiness, dizziness, and confusion, are similar to symptoms of having too much alcohol.  Do not drink alcohol if: ? Your health care provider tells you not to drink. ? You are pregnant, may be pregnant, or are planning to become pregnant.  If you drink alcohol: ? Do not drink on an empty stomach. ? Limit how much you use to:  0-1 drink a day for women.  0-2 drinks a day for men. ? Be aware of how much alcohol is in your drink. In the [place] one drink equals one 12 oz bottle of beer (355 mL), one 5 oz glass of wine (148 mL), or one 1 oz glass of hard liquor (44 mL). ? Keep yourself hydrated with water, diet soda, or unsweetened iced tea.  Keep in mind that regular soda, juice, and other mixers may   contain a lot of sugar and must be counted as carbs. What are tips for following this plan? Reading food labels  Start by checking the serving size on the "Nutrition Facts" label of packaged foods and drinks. The amount of calories, carbs, fats, and other nutrients listed on the label is based on one serving of the item. Many items contain more than one serving per package.  Check the total grams (g) of carbs in one serving. You can calculate the number of servings of carbs in one serving by dividing the total carbs by 15. For example, if a food has 30 g of total carbs per serving, it would be equal to 2 servings of carbs.  Check the number of grams (g) of saturated fats and trans fats in one serving. Choose foods that have a low amount or none of these fats.  Check the number of milligrams (mg) of salt (sodium) in one serving. Most people should limit total  sodium intake to less than 2,300 mg per day.  Always check the nutrition information of foods labeled as "low-fat" or "nonfat." These foods may be higher in added sugar or refined carbs and should be avoided.  Talk to your dietitian to identify your daily goals for nutrients listed on the label. Shopping  Avoid buying canned, pre-made, or processed foods. These foods tend to be high in fat, sodium, and added sugar.  Shop around the outside edge of the grocery store. This is where you will most often find fresh fruits and vegetables, bulk grains, fresh meats, and fresh dairy. Cooking  Use low-heat cooking methods, such as baking, instead of high-heat cooking methods like deep frying.  Cook using healthy oils, such as olive, canola, or sunflower oil.  Avoid cooking with butter, cream, or high-fat meats. Meal planning  Eat meals and snacks regularly, preferably at the same times every day. Avoid going long periods of time without eating.  Eat foods that are high in fiber, such as fresh fruits, vegetables, beans, and whole grains. Talk with your dietitian about how many servings of carbs you can eat at each meal.  Eat 4-6 oz (112-168 g) of lean protein each day, such as lean meat, chicken, fish, eggs, or tofu. One ounce (oz) of lean protein is equal to: ? 1 oz (28 g) of meat, chicken, or fish. ? 1 egg. ?  cup (62 g) of tofu.  Eat some foods each day that contain healthy fats, such as avocado, nuts, seeds, and fish.   What foods should I eat? Fruits Berries. Apples. Oranges. Peaches. Apricots. Plums. Grapes. Mango. Papaya. Pomegranate. Kiwi. Cherries. Vegetables Lettuce. Spinach. Leafy greens, including kale, chard, collard greens, and mustard greens. Beets. Cauliflower. Cabbage. Broccoli. Carrots. Green beans. Tomatoes. Peppers. Onions. Cucumbers. Brussels sprouts. Grains Whole grains, such as whole-wheat or whole-grain bread, crackers, tortillas, cereal, and pasta. Unsweetened  oatmeal. Quinoa. Brown or wild rice. Meats and other proteins Seafood. Poultry without skin. Lean cuts of poultry and beef. Tofu. Nuts. Seeds. Dairy Low-fat or fat-free dairy products such as milk, yogurt, and cheese. The items listed above may not be a complete list of foods and beverages you can eat. Contact a dietitian for more information. What foods should I avoid? Fruits Fruits canned with syrup. Vegetables Canned vegetables. Frozen vegetables with butter or cream sauce. Grains Refined white flour and flour products such as bread, pasta, snack foods, and cereals. Avoid all processed foods. Meats and other proteins Fatty cuts of meat. Poultry with skin.   Breaded or fried meats. Processed meat. Avoid saturated fats. Dairy Full-fat yogurt, cheese, or milk. Beverages Sweetened drinks, such as soda or iced tea. The items listed above may not be a complete list of foods and beverages you should avoid. Contact a dietitian for more information. Questions to ask a health care provider  Do I need to meet with a diabetes educator?  Do I need to meet with a dietitian?  What number can I call if I have questions?  When are the best times to check my blood glucose? Where to find more information:  American Diabetes Association: diabetes.org  Academy of Nutrition and Dietetics: www.eatright.org  National Institute of Diabetes and Digestive and Kidney Diseases: www.niddk.nih.gov  Association of Diabetes Care and Education Specialists: www.diabeteseducator.org Summary  It is important to have healthy eating habits because your blood sugar (glucose) levels are greatly affected by what you eat and drink.  A healthy meal plan will help you control your blood glucose and maintain a healthy lifestyle.  Your health care provider may recommend that you work with a dietitian to make a meal plan that is best for you.  Keep in mind that carbohydrates (carbs) and alcohol have immediate effects  on your blood glucose levels. It is important to count carbs and to use alcohol carefully. This information is not intended to replace advice given to you by your health care provider. Make sure you discuss any questions you have with your health care provider. Document Revised: 01/17/2019 Document Reviewed: 01/17/2019 Elsevier Patient Education  2021 Elsevier Inc.  

## 2020-07-17 NOTE — Progress Notes (Signed)
Patient ID: Morgan Hill, female    DOB: [birth year], 75 y.o.   MRN: [id number]   Chief Complaint  Patient presents with   Diabetes   Subjective:    HPI   H/o DM2- diet controlled.  Not on medication for diabetes.    Dm2- Compliant with medications. Checking blood glucose.   Not seeing any high or low numbers.  Denies polyuria or polydipsia.  Eye exam: overdue. Foot exam: no new concerns.   gerd- protonix. Doing well with this medication.  And h/o vertigo and had clogging in rt ear.  flonase and allergy meds, zyrtec. And resolved the serous effusion. And no further vertigo.   Depression- taking celexa 65m. Overall controlling her symptoms.  Has good and bad days but overall feeling good.  HLD- doing well no new concerns.  Compliant with meds. No chest pain, palpitations, myalgias or joint pains. Has improved and LDL 159 and now at 144.  Has good HDL 72.     Medical History EEyannahas a past medical history of Back pain, CTS (carpal tunnel syndrome), and GERD (gastroesophageal reflux disease).   Outpatient Encounter Medications as of 07/17/2020  Medication Sig   citalopram (CELEXA) 10 MG tablet Take 1 tablet (10 mg total) by mouth daily.   fluticasone (FLONASE) 50 MCG/ACT nasal spray SPRAY 2 SPRAYS INTO EACH NOSTRIL EVERY DAY   levothyroxine (SYNTHROID) 25 MCG tablet Take 1 tablet (25 mcg total) by mouth daily.   meclizine (ANTIVERT) 25 MG tablet Take 1 tablet (25 mg total) by mouth 3 (three) times daily as needed for dizziness.   OVER THE COUNTER MEDICATION Vit d 3 2000 iu daily magnessium   pantoprazole (PROTONIX) 40 MG tablet Take 1 tablet (40 mg total) by mouth daily.   Propylene Glycol (SYSTANE BALANCE OP) Apply 1 drop to eye in the morning, at noon, and at bedtime. Dry eye   [DISCONTINUED] fluticasone (FLONASE) 50 MCG/ACT nasal spray Place 2 sprays into both nostrils daily.   [DISCONTINUED] lidocaine (XYLOCAINE) 2 % solution Use as directed 15 mLs in the  mouth or throat as needed for mouth pain.   [DISCONTINUED] tizanidine (ZANAFLEX) 2 MG capsule Take 1 capsule (2 mg total) by mouth at bedtime as needed for muscle spasms.   No facility-administered encounter medications on file as of 07/17/2020.     Review of Systems  Constitutional: Negative for chills and fever.  HENT: Negative for congestion, rhinorrhea and sore throat.   Respiratory: Negative for cough, shortness of breath and wheezing.   Cardiovascular: Negative for chest pain and leg swelling.  Gastrointestinal: Negative for abdominal pain, diarrhea, nausea and vomiting.  Genitourinary: Negative for dysuria and frequency.  Musculoskeletal: Negative for arthralgias and back pain.  Skin: Negative for rash.  Neurological: Negative for dizziness, weakness and headaches.       H/o vertigo- improved     Vitals BP 138/74   Pulse 69   Temp (!) 97.3 F (36.3 C)   Ht 5' 4"  (1.626 m)   Wt 200 lb (90.7 kg)   SpO2 99%   BMI 34.33 kg/m   Objective:   Physical Exam Vitals and nursing note reviewed.  Constitutional:      Appearance: Normal appearance.  HENT:     Head: Normocephalic and atraumatic.     Nose: Nose normal.     Mouth/Throat:     Mouth: Mucous membranes are moist.     Pharynx: Oropharynx is clear.  Eyes:     Extraocular  Movements: Extraocular movements intact.     Conjunctiva/sclera: Conjunctivae normal.     Pupils: Pupils are equal, round, and reactive to light.  Cardiovascular:     Rate and Rhythm: Normal rate and regular rhythm.     Pulses: Normal pulses.     Heart sounds: Normal heart sounds.  Pulmonary:     Effort: Pulmonary effort is normal.     Breath sounds: Normal breath sounds. No wheezing, rhonchi or rales.  Musculoskeletal:        General: Normal range of motion.     Right lower leg: No edema.     Left lower leg: No edema.  Skin:    General: Skin is warm and dry.     Findings: No lesion or rash.  Neurological:     General: No focal deficit  present.     Mental Status: She is alert and oriented to person, place, and time.  Psychiatric:        Mood and Affect: Mood normal.        Behavior: Behavior normal.      Assessment and Plan   1. Type 2 diabetes mellitus without complication, without long-term current use of insulin (HCC) - CMP14+EGFR - Hemoglobin A1c - Microalbumin, urine  2. Elevated TSH - TSH - T3, free - T4, free  3. Current moderate episode of major depressive disorder without prior episode ([facility])  4. Gastroesophageal reflux disease, unspecified whether esophagitis present   HM-Declining colonoscopy, dexa scan and mammogram.  DM2- diet controlled.  a1c 6.5.  Will check labs on next visit.  Pt declining glucometer for now.  Gerd-stable. Cont meds.  Depression-stable. Cont meds.  Vertigo-resolved. Cont to monitor.  TSH- elevated on 2/22- at 6.7, on recheck it's at 8.5. having weight gain, and no other side effects at this time. Will start levothyroxine.  Recheck at 6 wks of TSH.  Return in about 3 months (around 10/17/2020) for dm2, hld.  BP Readings from Last 3 Encounters:  07/17/20 138/74  04/19/20 138/84  03/28/20 (!) 145/81   Addendum- has elevated TSH.  Will give levothyroxine 70mg.

## 2020-07-18 DIAGNOSIS — E039 Hypothyroidism, unspecified: Secondary | ICD-10-CM | POA: Diagnosis not present

## 2020-07-18 DIAGNOSIS — R7989 Other specified abnormal findings of blood chemistry: Secondary | ICD-10-CM | POA: Diagnosis not present

## 2020-07-19 ENCOUNTER — Encounter: Payer: Self-pay | Admitting: Family Medicine

## 2020-07-19 LAB — TSH: TSH: 8.51 u[IU]/mL — ABNORMAL HIGH (ref 0.450–4.500)

## 2020-07-19 LAB — T3, FREE: T3, Free: 2.8 pg/mL (ref 2.0–4.4)

## 2020-07-19 LAB — T4, FREE: Free T4: 0.87 ng/dL (ref 0.82–1.77)

## 2020-07-19 MED ORDER — LEVOTHYROXINE SODIUM 25 MCG PO TABS: 25.0000 ug | ORAL_TABLET | Freq: Every day | ORAL | 1 refills | Status: DC

## 2020-08-19 ENCOUNTER — Encounter: Payer: Self-pay | Admitting: Family Medicine

## 2020-08-19 DIAGNOSIS — E039 Hypothyroidism, unspecified: Secondary | ICD-10-CM | POA: Insufficient documentation

## 2020-10-18 ENCOUNTER — Other Ambulatory Visit: Payer: Self-pay

## 2020-10-18 ENCOUNTER — Ambulatory Visit (INDEPENDENT_AMBULATORY_CARE_PROVIDER_SITE_OTHER): Payer: Medicare HMO | Admitting: Family Medicine

## 2020-10-18 VITALS — BP 135/68 | HR 98 | Temp 97.8°F | Ht 64.0 in | Wt 200.6 lb

## 2020-10-18 DIAGNOSIS — E119 Type 2 diabetes mellitus without complications: Secondary | ICD-10-CM

## 2020-10-18 DIAGNOSIS — U071 COVID-19: Secondary | ICD-10-CM | POA: Diagnosis not present

## 2020-10-18 DIAGNOSIS — K219 Gastro-esophageal reflux disease without esophagitis: Secondary | ICD-10-CM

## 2020-10-18 DIAGNOSIS — R69 Illness, unspecified: Secondary | ICD-10-CM | POA: Diagnosis not present

## 2020-10-18 DIAGNOSIS — R7989 Other specified abnormal findings of blood chemistry: Secondary | ICD-10-CM | POA: Diagnosis not present

## 2020-10-18 DIAGNOSIS — F321 Major depressive disorder, single episode, moderate: Secondary | ICD-10-CM | POA: Diagnosis not present

## 2020-10-18 DIAGNOSIS — E039 Hypothyroidism, unspecified: Secondary | ICD-10-CM

## 2020-10-18 DIAGNOSIS — R42 Dizziness and giddiness: Secondary | ICD-10-CM

## 2020-10-18 MED ORDER — PANTOPRAZOLE SODIUM 40 MG PO TBEC: 40.0000 mg | DELAYED_RELEASE_TABLET | Freq: Every day | ORAL | 1 refills | Status: DC

## 2020-10-18 MED ORDER — MECLIZINE HCL 25 MG PO TABS: 25.0000 mg | ORAL_TABLET | Freq: Three times a day (TID) | ORAL | 1 refills | Status: DC | PRN

## 2020-10-18 MED ORDER — CITALOPRAM HYDROBROMIDE 10 MG PO TABS: 10.0000 mg | ORAL_TABLET | Freq: Every day | ORAL | 1 refills | Status: DC

## 2020-10-18 NOTE — Progress Notes (Signed)
Patient ID: Morgan Hill, female    DOB: 1945-11-11, 75 y.o.   MRN: [id number]   Chief Complaint  Patient presents with   Diabetes    Follow up Tested positive for Covid on 8/10 or 12 but feeling much better   Subjective:    HPI Pt is negative  about 2 days ago for covid.  Had cold symptoms and sinus issues and fatigue.  Now feeling better.  Had covid 10/02/20.  Smell has returned.  Her daughter and granddaughter had it 4 wks prior to her getting sick.  Dm2- Compliant with medications. Checking blood glucose.   Not seeing any high or low numbers.  Denies polyuria or polydipsia.  Eye exam: overdue Foot exam: no new concerns. Lab Results  Component Value Date   HGBA1C 6.6 (H) 10/18/2020    Elevated TSH.  Not having any symptoms when was first identified in February 2022.  Medical History Eithel has a past medical history of Back pain, CTS (carpal tunnel syndrome), and GERD (gastroesophageal reflux disease).   Outpatient Encounter Medications as of 10/18/2020  Medication Sig   citalopram (CELEXA) 10 MG tablet Take 1 tablet (10 mg total) by mouth daily.   fluticasone (FLONASE) 50 MCG/ACT nasal spray SPRAY 2 SPRAYS INTO EACH NOSTRIL EVERY DAY   meclizine (ANTIVERT) 25 MG tablet Take 1 tablet (25 mg total) by mouth 3 (three) times daily as needed for dizziness.   OVER THE COUNTER MEDICATION Vit d 3 2000 iu daily magnessium   pantoprazole (PROTONIX) 40 MG tablet Take 1 tablet (40 mg total) by mouth daily.   Propylene Glycol (SYSTANE BALANCE OP) Apply 1 drop to eye in the morning, at noon, and at bedtime. Dry eye   [DISCONTINUED] citalopram (CELEXA) 10 MG tablet Take 1 tablet (10 mg total) by mouth daily.   [DISCONTINUED] levothyroxine (SYNTHROID) 25 MCG tablet Take 1 tablet (25 mcg total) by mouth daily.   [DISCONTINUED] meclizine (ANTIVERT) 25 MG tablet Take 1 tablet (25 mg total) by mouth 3 (three) times daily as needed for dizziness.   [DISCONTINUED] pantoprazole  (PROTONIX) 40 MG tablet Take 1 tablet (40 mg total) by mouth daily.   No facility-administered encounter medications on file as of 10/18/2020.     Review of Systems  Constitutional:  Positive for fatigue. Negative for chills and fever.  HENT:  Negative for congestion, rhinorrhea and sore throat.   Respiratory:  Positive for cough. Negative for shortness of breath and wheezing.   Cardiovascular:  Negative for chest pain and leg swelling.  Gastrointestinal:  Negative for abdominal pain, diarrhea, nausea and vomiting.  Genitourinary:  Negative for dysuria and frequency.  Musculoskeletal:  Negative for arthralgias and back pain.  Skin:  Negative for rash.  Neurological:  Negative for dizziness, weakness and headaches.    Vitals BP 135/68   Pulse 98   Temp 97.8 F (36.6 C) (Oral)   Ht _0  (1.626 m)   Wt 200 lb 9.6 oz (91 kg)   SpO2 98%   BMI 34.43 kg/m   Objective:   Physical Exam Vitals and nursing note reviewed.  Constitutional:      Appearance: Normal appearance.  HENT:     Head: Normocephalic and atraumatic.     Nose: Nose normal.     Mouth/Throat:     Mouth: Mucous membranes are moist.     Pharynx: Oropharynx is clear.  Eyes:     Extraocular Movements: Extraocular movements intact.     Conjunctiva/sclera: Conjunctivae  normal.     Pupils: Pupils are equal, round, and reactive to light.  Cardiovascular:     Rate and Rhythm: Normal rate and regular rhythm.     Pulses: Normal pulses.     Heart sounds: Normal heart sounds.  Pulmonary:     Effort: Pulmonary effort is normal.     Breath sounds: Normal breath sounds. No wheezing, rhonchi or rales.  Musculoskeletal:        General: Normal range of motion.     Right lower leg: No edema.     Left lower leg: No edema.  Skin:    General: Skin is warm and dry.     Findings: No lesion or rash.  Neurological:     General: No focal deficit present.     Mental Status: She is alert and oriented to person, place, and time.   Psychiatric:        Mood and Affect: Mood normal.        Behavior: Behavior normal.     Assessment and Plan   1. Type 2 diabetes mellitus without complication, without long-term current use of insulin (HCC) - CMP14+EGFR - Hemoglobin A1c - Microalbumin, urine - Lipid panel  2. Elevated TSH - TSH  3. Hypothyroidism, unspecified type  4. Current moderate episode of major depressive disorder without prior episode (HCC) - citalopram (CELEXA) 10 MG tablet; Take 1 tablet (10 mg total) by mouth daily.  Dispense: 90 tablet; Refill: 1  5. Vertigo - meclizine (ANTIVERT) 25 MG tablet; Take 1 tablet (25 mg total) by mouth 3 (three) times daily as needed for dizziness.  Dispense: 30 tablet; Refill: 1  6. Gastroesophageal reflux disease, unspecified whether esophagitis present - pantoprazole (PROTONIX) 40 MG tablet; Take 1 tablet (40 mg total) by mouth daily.  Dispense: 90 tablet; Refill: 1  7. COVID-19   Diabetes type 2-stable, continue diet modifications, decrease carbs in diet and increase in exercise.  A1c stable at 6.6.  Hyperlipidemia-uncontrolled, elevation in cholesterol compared to last visit and LDL.  Patient to continue to eat low-cholesterol diet.  Hypothyroidism- new dx, patient has elevated TSH at 8.2.  Patient currently having some concerns of hair loss and fatigue.  Has been slowly increasing since February 2022.  Will give trial of levothyroxine 50 mcg.  Patient to follow-up with new provider for recheck of thyroid function test.  Improved from covid.  Slight nasal congestion. Occ using nasonex.  Using flonase in spirng and fall allergies.  GERD-stable continue Protonix try to decrease this over time or go to every other day.  Depression-stable, continue Celexa.  Vertigo-improved patient to use meclizine as needed.  Return in about 6 months (around 04/20/2021) for f/u dm2, hypothyroid, depression.

## 2020-10-20 LAB — CMP14+EGFR
ALT: 19 IU/L (ref 0–32)
AST: 17 IU/L (ref 0–40)
Albumin/Globulin Ratio: 1.6 (ref 1.2–2.2)
Albumin: 4.7 g/dL (ref 3.7–4.7)
Alkaline Phosphatase: 59 IU/L (ref 44–121)
BUN/Creatinine Ratio: 15 (ref 12–28)
BUN: 14 mg/dL (ref 8–27)
Bilirubin Total: 0.5 mg/dL (ref 0.0–1.2)
CO2: 22 mmol/L (ref 20–29)
Calcium: 9.3 mg/dL (ref 8.7–10.3)
Chloride: 100 mmol/L (ref 96–106)
Creatinine, Ser: 0.92 mg/dL (ref 0.57–1.00)
Globulin, Total: 3 g/dL (ref 1.5–4.5)
Glucose: 119 mg/dL — ABNORMAL HIGH (ref 65–99)
Potassium: 4.5 mmol/L (ref 3.5–5.2)
Sodium: 135 mmol/L (ref 134–144)
Total Protein: 7.7 g/dL (ref 6.0–8.5)
eGFR: 66 mL/min/{1.73_m2} (ref >59)

## 2020-10-20 LAB — MICROALBUMIN, URINE: Microalbumin, Urine: 6.3 ug/mL

## 2020-10-20 LAB — LIPID PANEL
Chol/HDL Ratio: 3.5 ratio (ref 0.0–4.4)
Cholesterol, Total: 252 mg/dL — ABNORMAL HIGH (ref 100–199)
HDL: 73 mg/dL (ref >39)
LDL Chol Calc (NIH): 166 mg/dL — ABNORMAL HIGH (ref 0–99)
Triglycerides: 79 mg/dL (ref 0–149)
VLDL Cholesterol Cal: 13 mg/dL (ref 5–40)

## 2020-10-20 LAB — HEMOGLOBIN A1C
Est. average glucose Bld gHb Est-mCnc: 143 mg/dL
Hgb A1c MFr Bld: 6.6 % — ABNORMAL HIGH (ref 4.8–5.6)

## 2020-10-20 LAB — TSH: TSH: 8.28 u[IU]/mL — ABNORMAL HIGH (ref 0.450–4.500)

## 2020-10-21 ENCOUNTER — Other Ambulatory Visit: Payer: Self-pay

## 2020-10-21 DIAGNOSIS — R7989 Other specified abnormal findings of blood chemistry: Secondary | ICD-10-CM

## 2020-10-21 MED ORDER — LEVOTHYROXINE SODIUM 50 MCG PO TABS: 50.0000 ug | ORAL_TABLET | Freq: Every day | ORAL | 1 refills | Status: DC

## 2021-01-10 DIAGNOSIS — H21541 Posterior synechiae (iris), right eye: Secondary | ICD-10-CM | POA: Diagnosis not present

## 2021-01-10 DIAGNOSIS — H25813 Combined forms of age-related cataract, bilateral: Secondary | ICD-10-CM | POA: Diagnosis not present

## 2021-01-12 ENCOUNTER — Other Ambulatory Visit: Payer: Self-pay | Admitting: Family Medicine

## 2021-01-13 ENCOUNTER — Other Ambulatory Visit: Payer: Self-pay

## 2021-01-13 DIAGNOSIS — R7989 Other specified abnormal findings of blood chemistry: Secondary | ICD-10-CM

## 2021-01-13 MED ORDER — LEVOTHYROXINE SODIUM 50 MCG PO TABS: 50.0000 ug | ORAL_TABLET | Freq: Every day | ORAL | 0 refills | Status: DC

## 2021-03-24 DIAGNOSIS — H2512 Age-related nuclear cataract, left eye: Secondary | ICD-10-CM | POA: Diagnosis not present

## 2021-03-24 DIAGNOSIS — H2511 Age-related nuclear cataract, right eye: Secondary | ICD-10-CM | POA: Diagnosis not present

## 2021-03-24 DIAGNOSIS — Z01818 Encounter for other preprocedural examination: Secondary | ICD-10-CM | POA: Diagnosis not present

## 2021-04-10 DIAGNOSIS — H2511 Age-related nuclear cataract, right eye: Secondary | ICD-10-CM | POA: Diagnosis not present

## 2021-04-24 ENCOUNTER — Ambulatory Visit (INDEPENDENT_AMBULATORY_CARE_PROVIDER_SITE_OTHER): Payer: Medicare HMO | Admitting: Family Medicine

## 2021-04-24 ENCOUNTER — Other Ambulatory Visit: Payer: Self-pay

## 2021-04-24 VITALS — BP 145/83 | HR 72 | Temp 98.1°F | Ht 64.0 in | Wt 199.2 lb

## 2021-04-24 DIAGNOSIS — Z130 Encounter for screening for diseases of the blood and blood-forming organs and certain disorders involving the immune mechanism: Secondary | ICD-10-CM | POA: Diagnosis not present

## 2021-04-24 DIAGNOSIS — R69 Illness, unspecified: Secondary | ICD-10-CM | POA: Diagnosis not present

## 2021-04-24 DIAGNOSIS — K219 Gastro-esophageal reflux disease without esophagitis: Secondary | ICD-10-CM

## 2021-04-24 DIAGNOSIS — F321 Major depressive disorder, single episode, moderate: Secondary | ICD-10-CM

## 2021-04-24 DIAGNOSIS — E039 Hypothyroidism, unspecified: Secondary | ICD-10-CM

## 2021-04-24 DIAGNOSIS — E785 Hyperlipidemia, unspecified: Secondary | ICD-10-CM | POA: Diagnosis not present

## 2021-04-24 DIAGNOSIS — E119 Type 2 diabetes mellitus without complications: Secondary | ICD-10-CM

## 2021-04-24 MED ORDER — PANTOPRAZOLE SODIUM 40 MG PO TBEC: 40.0000 mg | DELAYED_RELEASE_TABLET | Freq: Every day | ORAL | 1 refills | Status: DC

## 2021-04-24 MED ORDER — CITALOPRAM HYDROBROMIDE 10 MG PO TABS: 10.0000 mg | ORAL_TABLET | Freq: Every day | ORAL | 1 refills | Status: DC

## 2021-04-24 NOTE — Assessment & Plan Note (Signed)
Uncontrolled.  We discussed current guideline recommendations.  Patient would like to see her lipid panel results before making her decision about starting pharmacotherapy.  I strongly recommended moderate intensity statin therapy. ?

## 2021-04-24 NOTE — Assessment & Plan Note (Signed)
Unsure of control.  TSH today. ?

## 2021-04-24 NOTE — Assessment & Plan Note (Signed)
Has been diet controlled.  A1c today. 

## 2021-04-24 NOTE — Assessment & Plan Note (Signed)
Stable on Celexa.  Continue.  Refilled today 

## 2021-04-24 NOTE — Assessment & Plan Note (Signed)
Stable on Protonix.  Continue. 

## 2021-04-24 NOTE — Progress Notes (Signed)
? ?Subjective:  ?Patient ID: Morgan Hill, female    DOB: 1946/01/23  Age: 76 y.o. MRN: [id number] ? ?CC: ?Chief Complaint  ?Patient presents with  ? Establish Care  ? Diabetes  ? Cataract  ?  Patient had cataract surgery on right eye on 04/10/21  ? ? ?HPI: ? ?76 year old female with diet controlled DM-2, hypothyroidism, hyperlipidemia, GERD, obesity presents for follow-up. ? ?Diabetes has been diet controlled.  Last A1c was 6.6.  She is on no pharmacotherapy at this time.  Needs labs. ? ?Last TSH available was 8.280.  She is currently on Synthroid 50 mcg daily.  She is feeling well.  Needs TSH for further dilation. ? ?GERD stable on Protonix.  Needs refill. ? ?Depression stable on Celexa.  Needs refill. ? ?I am not sure if anyone has talked to her regarding her lipids.  Last LDL was 166.  Given type 2 diabetes and current age, guidelines recommend moderate intensity statin.  She is not on pharmacotherapy.  We will discuss this today. ? ?Patient Active Problem List  ? Diagnosis Date Noted  ? Hyperlipidemia 04/24/2021  ? Hypothyroidism 08/19/2020  ? Depression 07/14/2014  ? Type 2 diabetes mellitus without complications ([place]) 31/51/7616  ? GERD (gastroesophageal reflux disease) 05/03/2013  ? Obesity 05/02/2013  ? ? ?Social Hx   ?Social History  ? ?Socioeconomic History  ? Marital status: Single  ?  Spouse name: Not on file  ? Number of children: Not on file  ? Years of education: Not on file  ? Highest education level: Not on file  ?Occupational History  ? Not on file  ?Tobacco Use  ? Smoking status: Former  ?  Types: Cigarettes  ?  Quit date: 02/24/1995  ?  Years since quitting: 26.1  ? Smokeless tobacco: Former  ?  Quit date: 01/11/1996  ?Vaping Use  ? Vaping Use: Never used  ?Substance and Sexual Activity  ? Alcohol use: Yes  ?  Alcohol/week: 1.0 standard drink  ?  Types: 1 Glasses of wine per week  ?  Comment: occasional  ? Drug use: No  ? Sexual activity: Not on file  ?Other Topics Concern  ? Not on file  ?Social  History Narrative  ? Not on file  ? ?Social Determinants of Health  ? ?Financial Resource Strain: Not on file  ?Food Insecurity: Not on file  ?Transportation Needs: Not on file  ?Physical Activity: Not on file  ?Stress: Not on file  ?Social Connections: Not on file  ? ? ?Review of Systems  ?Constitutional: Negative.   ?Respiratory: Negative.    ?Cardiovascular: Negative.   ? ?Objective:  ?BP (!) 145/83   Pulse 72   Temp 98.1 ?F (36.7 ?C) (Oral)   Ht 5' 4"  (1.626 m)   Wt 199 lb 3.2 oz (90.4 kg)   SpO2 97%   BMI 34.19 kg/m?  ? ?BP/Weight 04/24/2021 10/18/2020 07/17/2020  ?Systolic BP 073 710 626  ?Diastolic BP 83 68 74  ?Wt. (Lbs) 199.2 200.6 200  ?BMI 34.19 34.43 34.33  ? ? ?Physical Exam ?Vitals and nursing note reviewed.  ?Constitutional:   ?   General: She is not in acute distress. ?   Appearance: Normal appearance. She is not ill-appearing.  ?HENT:  ?   Head: Normocephalic and atraumatic.  ?Eyes:  ?   General:     ?   Right eye: No discharge.     ?   Left eye: No discharge.  ?  Conjunctiva/sclera: Conjunctivae normal.  ?Cardiovascular:  ?   Rate and Rhythm: Normal rate and regular rhythm.  ?Pulmonary:  ?   Effort: Pulmonary effort is normal.  ?   Breath sounds: Normal breath sounds. No wheezing, rhonchi or rales.  ?Neurological:  ?   Mental Status: She is alert.  ?Psychiatric:     ?   Mood and Affect: Mood normal.     ?   Behavior: Behavior normal.  ? ? ?Lab Results  ?Component Value Date  ? WBC 4.9 04/19/2020  ? HGB 14.0 04/19/2020  ? HCT 42.5 04/19/2020  ? PLT 189 04/19/2020  ? GLUCOSE 119 (H) 10/18/2020  ? CHOL 252 (H) 10/18/2020  ? TRIG 79 10/18/2020  ? HDL 73 10/18/2020  ? LDLCALC 166 (H) 10/18/2020  ? ALT 19 10/18/2020  ? AST 17 10/18/2020  ? NA 135 10/18/2020  ? K 4.5 10/18/2020  ? CL 100 10/18/2020  ? CREATININE 0.92 10/18/2020  ? BUN 14 10/18/2020  ? CO2 22 10/18/2020  ? TSH 8.280 (H) 10/18/2020  ? HGBA1C 6.6 (H) 10/18/2020  ? ? ? ?Assessment & Plan:  ? ?Problem List Items Addressed This Visit   ? ?   ? Digestive  ? GERD (gastroesophageal reflux disease)  ?  Stable on Protonix.  Continue. ?  ?  ? Relevant Medications  ? pantoprazole (PROTONIX) 40 MG tablet  ?  ? Endocrine  ? Type 2 diabetes mellitus without complications ([place]) - Primary  ?  Has been diet controlled.  A1c today. ?  ?  ? Relevant Orders  ? Hemoglobin A1c  ? CMP14+EGFR  ? Microalbumin / creatinine urine ratio  ? Hypothyroidism  ?  Unsure of control.  TSH today. ?  ?  ? Relevant Orders  ? TSH  ?  ? Other  ? Depression  ?  Stable on Celexa.  Continue.  Refilled today. ?  ?  ? Relevant Medications  ? citalopram (CELEXA) 10 MG tablet  ? Hyperlipidemia  ?  Uncontrolled.  We discussed current guideline recommendations.  Patient would like to see her lipid panel results before making her decision about starting pharmacotherapy.  I strongly recommended moderate intensity statin therapy. ?  ?  ? Relevant Orders  ? Lipid panel  ? ?Other Visit Diagnoses   ? ? Screening for deficiency anemia      ? Relevant Orders  ? CBC  ? ?  ? ? ?Meds ordered this encounter  ?Medications  ? pantoprazole (PROTONIX) 40 MG tablet  ?  Sig: Take 1 tablet (40 mg total) by mouth daily.  ?  Dispense:  90 tablet  ?  Refill:  1  ? citalopram (CELEXA) 10 MG tablet  ?  Sig: Take 1 tablet (10 mg total) by mouth daily.  ?  Dispense:  90 tablet  ?  Refill:  1  ? ? ?Follow-up:  Return in about 6 months (around 10/25/2021). ? ?Thersa Salt DO ?[place] ? ?

## 2021-04-24 NOTE — Patient Instructions (Signed)
I have refilled your medications (minus the levothyroxine). ? ?Labs today. ? ?Follow up in 6 months. ?

## 2021-04-25 LAB — CMP14+EGFR
ALT: 21 IU/L (ref 0–32)
AST: 20 IU/L (ref 0–40)
Albumin/Globulin Ratio: 1.5 (ref 1.2–2.2)
Albumin: 4.8 g/dL — ABNORMAL HIGH (ref 3.7–4.7)
Alkaline Phosphatase: 58 IU/L (ref 44–121)
BUN/Creatinine Ratio: 21 (ref 12–28)
BUN: 18 mg/dL (ref 8–27)
Bilirubin Total: 0.8 mg/dL (ref 0.0–1.2)
CO2: 22 mmol/L (ref 20–29)
Calcium: 9.5 mg/dL (ref 8.7–10.3)
Chloride: 102 mmol/L (ref 96–106)
Creatinine, Ser: 0.85 mg/dL (ref 0.57–1.00)
Globulin, Total: 3.1 g/dL (ref 1.5–4.5)
Glucose: 123 mg/dL — ABNORMAL HIGH (ref 70–99)
Potassium: 4.6 mmol/L (ref 3.5–5.2)
Sodium: 137 mmol/L (ref 134–144)
Total Protein: 7.9 g/dL (ref 6.0–8.5)
eGFR: 72 mL/min/{1.73_m2} (ref >59)

## 2021-04-25 LAB — LIPID PANEL
Chol/HDL Ratio: 2.7 ratio (ref 0.0–4.4)
Cholesterol, Total: 217 mg/dL — ABNORMAL HIGH (ref 100–199)
HDL: 80 mg/dL (ref >39)
LDL Chol Calc (NIH): 126 mg/dL — ABNORMAL HIGH (ref 0–99)
Triglycerides: 63 mg/dL (ref 0–149)
VLDL Cholesterol Cal: 11 mg/dL (ref 5–40)

## 2021-04-25 LAB — CBC
Hematocrit: 42.4 % (ref 34.0–46.6)
Hemoglobin: 14.3 g/dL (ref 11.1–15.9)
MCH: 29.4 pg (ref 26.6–33.0)
MCHC: 33.7 g/dL (ref 31.5–35.7)
MCV: 87 fL (ref 79–97)
Platelets: 169 10*3/uL (ref 150–450)
RBC: 4.86 x10E6/uL (ref 3.77–5.28)
RDW: 12.4 % (ref 11.7–15.4)
WBC: 8 10*3/uL (ref 3.4–10.8)

## 2021-04-25 LAB — MICROALBUMIN / CREATININE URINE RATIO
Creatinine, Urine: 108.2 mg/dL
Microalb/Creat Ratio: 4 mg/g creat (ref 0–29)
Microalbumin, Urine: 3.8 ug/mL

## 2021-04-25 LAB — HEMOGLOBIN A1C
Est. average glucose Bld gHb Est-mCnc: 146 mg/dL
Hgb A1c MFr Bld: 6.7 % — ABNORMAL HIGH (ref 4.8–5.6)

## 2021-04-25 LAB — TSH: TSH: 3.76 u[IU]/mL (ref 0.450–4.500)

## 2021-04-29 ENCOUNTER — Other Ambulatory Visit: Payer: Self-pay | Admitting: Family Medicine

## 2021-04-29 MED ORDER — ROSUVASTATIN CALCIUM 10 MG PO TABS: 10.0000 mg | ORAL_TABLET | Freq: Every day | ORAL | 3 refills | Status: DC

## 2021-05-02 DIAGNOSIS — H2512 Age-related nuclear cataract, left eye: Secondary | ICD-10-CM | POA: Diagnosis not present

## 2021-07-05 DIAGNOSIS — Z0100 Encounter for examination of eyes and vision without abnormal findings: Secondary | ICD-10-CM | POA: Diagnosis not present

## 2021-07-25 ENCOUNTER — Other Ambulatory Visit: Payer: Self-pay | Admitting: Family Medicine

## 2021-07-25 DIAGNOSIS — R7989 Other specified abnormal findings of blood chemistry: Secondary | ICD-10-CM

## 2021-11-05 ENCOUNTER — Encounter: Payer: Self-pay | Admitting: Family Medicine

## 2021-11-05 ENCOUNTER — Ambulatory Visit (INDEPENDENT_AMBULATORY_CARE_PROVIDER_SITE_OTHER): Payer: Medicare HMO | Admitting: Family Medicine

## 2021-11-05 VITALS — BP 165/80 | HR 66 | Temp 97.5°F | Ht 64.0 in | Wt 202.0 lb

## 2021-11-05 DIAGNOSIS — Z0000 Encounter for general adult medical examination without abnormal findings: Secondary | ICD-10-CM | POA: Insufficient documentation

## 2021-11-05 DIAGNOSIS — Z23 Encounter for immunization: Secondary | ICD-10-CM | POA: Diagnosis not present

## 2021-11-05 DIAGNOSIS — R030 Elevated blood-pressure reading, without diagnosis of hypertension: Secondary | ICD-10-CM | POA: Insufficient documentation

## 2021-11-05 DIAGNOSIS — E039 Hypothyroidism, unspecified: Secondary | ICD-10-CM

## 2021-11-05 DIAGNOSIS — Z13820 Encounter for screening for osteoporosis: Secondary | ICD-10-CM

## 2021-11-05 DIAGNOSIS — E119 Type 2 diabetes mellitus without complications: Secondary | ICD-10-CM | POA: Diagnosis not present

## 2021-11-05 DIAGNOSIS — E785 Hyperlipidemia, unspecified: Secondary | ICD-10-CM

## 2021-11-05 NOTE — Patient Instructions (Signed)
Call [phone] to schedule mammogram.  Labs today.  I have ordered the bone density as well.  Continue your current medications.  Follow up in 6 months.

## 2021-11-05 NOTE — Assessment & Plan Note (Signed)
Awaiting A1c.  Has been stable.

## 2021-11-05 NOTE — Assessment & Plan Note (Signed)
TSH today.  Continue current dosing of Synthroid.  If this needs to be adjusted I will do so after labs return.

## 2021-11-05 NOTE — Assessment & Plan Note (Signed)
BP elevated today.  Awaiting labs.  Prior BPs have been elevated as well.  Will likely need pharmacotherapy.

## 2021-11-05 NOTE — Assessment & Plan Note (Signed)
Lipid panel today to assess response to Crestor.  Continue Crestor. ?

## 2021-11-05 NOTE — Assessment & Plan Note (Signed)
Flu vaccine given.  Patient declines colonoscopy.  Declines pneumonia vaccine. DEXA scan ordered. Advised patient to call and schedule mammogram.

## 2021-11-05 NOTE — Progress Notes (Signed)
Subjective:  Patient ID: Morgan Hill, female    DOB: [birth year]  Age: 76 y.o. MRN: [id number]  CC: Follow-up  HPI:  76 year old female with GERD, hypothyroidism, type 2 diabetes, obesity, depression, hyperlipidemia, elevated blood pressure presents for follow-up.  Type 2 diabetes has been well controlled.  Most recent A1c 6.7.  Needs A1c today.  No current pharmacotherapy regarding her diabetes.  Patient is tolerating Crestor well.  She would like to get her labs drawn today to check her lipids and response to treatment.  Patient is concerned about hypoglycemia from use of statin.  Hypothyroidism has been stable on levothyroxine 50 mcg.  Patient is overdue for several preventative healthcare items.  We will perform foot exam today.  Needs DEXA scan.  Needs mammogram.  Declines colonoscopy.  Amenable to flu vaccine today.  Patient Active Problem List   Diagnosis Date Noted   Elevated BP without diagnosis of hypertension 11/05/2021   Preventative health care 11/05/2021   Hyperlipidemia 04/24/2021   Hypothyroidism 08/19/2020   Depression 07/14/2014   Type 2 diabetes mellitus without complications ([place]) 27/07/2374   GERD (gastroesophageal reflux disease) 05/03/2013   Obesity 05/02/2013    Social Hx   Social History   Socioeconomic History   Marital status: Single    Spouse name: Not on file   Number of children: Not on file   Years of education: Not on file   Highest education level: Not on file  Occupational History   Not on file  Tobacco Use   Smoking status: Former    Types: Cigarettes    Quit date: 02/24/1995    Years since quitting: 26.7   Smokeless tobacco: Former    Quit date: 01/11/1996  Vaping Use   Vaping Use: Never used  Substance and Sexual Activity   Alcohol use: Yes    Alcohol/week: 1.0 standard drink of alcohol    Types: 1 Glasses of wine per week    Comment: occasional   Drug use: No   Sexual activity: Not on file  Other Topics Concern   Not on  file  Social History Narrative   Not on file   Social Determinants of Health   Financial Resource Strain: Not on file  Food Insecurity: Not on file  Transportation Needs: Not on file  Physical Activity: Not on file  Stress: Not on file  Social Connections: Not on file    Review of Systems  Constitutional: Negative.   Respiratory: Negative.    Cardiovascular: Negative.    Objective:  BP (!) 165/80   Pulse 66   Temp (!) 97.5 F (36.4 C)   Ht 5' 4"  (1.626 m)   Wt 202 lb (91.6 kg)   SpO2 98%   BMI 34.67 kg/m      11/05/2021    8:54 AM 11/05/2021    8:40 AM 04/24/2021    9:58 AM  BP/Weight  Systolic BP 283 151 761  Diastolic BP 80 81 83  Wt. (Lbs)  202 199.2  BMI  34.67 kg/m2 34.19 kg/m2    Physical Exam Vitals and nursing note reviewed.  Constitutional:      General: She is not in acute distress.    Appearance: Normal appearance. She is obese.  HENT:     Head: Normocephalic and atraumatic.  Eyes:     General:        Right eye: No discharge.        Left eye: No discharge.     Conjunctiva/sclera:  Conjunctivae normal.  Cardiovascular:     Rate and Rhythm: Normal rate and regular rhythm.  Pulmonary:     Effort: Pulmonary effort is normal.     Breath sounds: Normal breath sounds. No wheezing, rhonchi or rales.  Neurological:     Mental Status: She is alert.  Psychiatric:        Mood and Affect: Mood normal.        Behavior: Behavior normal.     Lab Results  Component Value Date   WBC 8.0 04/24/2021   HGB 14.3 04/24/2021   HCT 42.4 04/24/2021   PLT 169 04/24/2021   GLUCOSE 123 (H) 04/24/2021   CHOL 217 (H) 04/24/2021   TRIG 63 04/24/2021   HDL 80 04/24/2021   LDLCALC 126 (H) 04/24/2021   ALT 21 04/24/2021   AST 20 04/24/2021   NA 137 04/24/2021   K 4.6 04/24/2021   CL 102 04/24/2021   CREATININE 0.85 04/24/2021   BUN 18 04/24/2021   CO2 22 04/24/2021   TSH 3.760 04/24/2021   HGBA1C 6.7 (H) 04/24/2021     Assessment & Plan:   Problem List  Items Addressed This Visit       Endocrine   Hypothyroidism    TSH today.  Continue current dosing of Synthroid.  If this needs to be adjusted I will do so after labs return.      Relevant Orders   TSH   Type 2 diabetes mellitus without complications ([place]) - Primary    Awaiting A1c.  Has been stable.      Relevant Orders   CMP14+EGFR   Hemoglobin A1c     Other   Elevated BP without diagnosis of hypertension    BP elevated today.  Awaiting labs.  Prior BPs have been elevated as well.  Will likely need pharmacotherapy.      Hyperlipidemia    Lipid panel today to assess response to Crestor.  Continue Crestor.      Relevant Orders   Lipid panel   Preventative health care    Flu vaccine given.  Patient declines colonoscopy.  Declines pneumonia vaccine. DEXA scan ordered. Advised patient to call and schedule mammogram.      Other Visit Diagnoses     Immunization due       Relevant Orders   Flu Vaccine QUAD High Dose(Fluad) (Completed)   Osteoporosis screening       Relevant Orders   DG Bone Density       Follow-up:  Return in about 6 months (around 05/06/2022).  [place]

## 2021-11-06 LAB — CMP14+EGFR
ALT: 21 IU/L (ref 0–32)
AST: 22 IU/L (ref 0–40)
Albumin/Globulin Ratio: 1.5 (ref 1.2–2.2)
Albumin: 4.6 g/dL (ref 3.8–4.8)
Alkaline Phosphatase: 63 IU/L (ref 44–121)
BUN/Creatinine Ratio: 13 (ref 12–28)
BUN: 11 mg/dL (ref 8–27)
Bilirubin Total: 0.8 mg/dL (ref 0.0–1.2)
CO2: 23 mmol/L (ref 20–29)
Calcium: 9.4 mg/dL (ref 8.7–10.3)
Chloride: 103 mmol/L (ref 96–106)
Creatinine, Ser: 0.82 mg/dL (ref 0.57–1.00)
Globulin, Total: 3.1 g/dL (ref 1.5–4.5)
Glucose: 117 mg/dL — ABNORMAL HIGH (ref 70–99)
Potassium: 4.7 mmol/L (ref 3.5–5.2)
Sodium: 139 mmol/L (ref 134–144)
Total Protein: 7.7 g/dL (ref 6.0–8.5)
eGFR: 75 mL/min/{1.73_m2} (ref >59)

## 2021-11-06 LAB — LIPID PANEL
Chol/HDL Ratio: 2.1 ratio (ref 0.0–4.4)
Cholesterol, Total: 148 mg/dL (ref 100–199)
HDL: 70 mg/dL (ref >39)
LDL Chol Calc (NIH): 62 mg/dL (ref 0–99)
Triglycerides: 83 mg/dL (ref 0–149)
VLDL Cholesterol Cal: 16 mg/dL (ref 5–40)

## 2021-11-06 LAB — HEMOGLOBIN A1C
Est. average glucose Bld gHb Est-mCnc: 140 mg/dL
Hgb A1c MFr Bld: 6.5 % — ABNORMAL HIGH (ref 4.8–5.6)

## 2021-11-06 LAB — TSH: TSH: 3.58 u[IU]/mL (ref 0.450–4.500)

## 2021-12-09 ENCOUNTER — Ambulatory Visit (HOSPITAL_COMMUNITY)
Admission: RE | Admit: 2021-12-09 | Discharge: 2021-12-09 | Disposition: A | Discharge status: Home / Self Care | Payer: Medicare HMO | Source: Ambulatory Visit | Attending: Family Medicine | Admitting: Family Medicine

## 2021-12-09 DIAGNOSIS — Z13820 Encounter for screening for osteoporosis: Secondary | ICD-10-CM | POA: Insufficient documentation

## 2021-12-09 DIAGNOSIS — Z780 Asymptomatic menopausal state: Secondary | ICD-10-CM | POA: Diagnosis not present

## 2021-12-09 DIAGNOSIS — M85851 Other specified disorders of bone density and structure, right thigh: Secondary | ICD-10-CM | POA: Diagnosis not present

## 2021-12-11 ENCOUNTER — Telehealth: Payer: Self-pay | Admitting: Family Medicine

## 2021-12-11 NOTE — Telephone Encounter (Signed)
  Left message for patient to call back and schedule Medicare Annual Wellness Visit (AWV) in office.   If unable to come into the office for AWV,  please offer to do virtually or by telephone.  No hx of AWV eligible for AWVI per [facility] as of  03/26/2017   Please schedule at anytime with [facility] Health Advisor.      45 minute appointment   Any questions, please call me at [phone]

## 2022-01-16 ENCOUNTER — Other Ambulatory Visit: Payer: Self-pay | Admitting: Family Medicine

## 2022-01-16 DIAGNOSIS — R7989 Other specified abnormal findings of blood chemistry: Secondary | ICD-10-CM

## 2022-01-17 ENCOUNTER — Other Ambulatory Visit: Payer: Self-pay | Admitting: Family Medicine

## 2022-01-17 DIAGNOSIS — K219 Gastro-esophageal reflux disease without esophagitis: Secondary | ICD-10-CM

## 2022-02-25 ENCOUNTER — Other Ambulatory Visit: Payer: Self-pay | Admitting: Family Medicine

## 2022-02-25 DIAGNOSIS — F321 Major depressive disorder, single episode, moderate: Secondary | ICD-10-CM

## 2022-04-16 ENCOUNTER — Other Ambulatory Visit: Payer: Self-pay | Admitting: Family Medicine

## 2022-05-23 ENCOUNTER — Other Ambulatory Visit: Payer: Self-pay | Admitting: Family Medicine

## 2022-05-23 DIAGNOSIS — F321 Major depressive disorder, single episode, moderate: Secondary | ICD-10-CM

## 2022-06-03 ENCOUNTER — Telehealth: Payer: Self-pay | Admitting: Family Medicine

## 2022-06-03 NOTE — Telephone Encounter (Signed)
Last OV 10/2021 last labs done lipid, tsh, A1c, cmp upcoming appt requesting labs

## 2022-06-03 NOTE — Telephone Encounter (Signed)
Patient is requesting labs for her 6 month diabetic follow up May 2.

## 2022-06-05 ENCOUNTER — Other Ambulatory Visit: Payer: Self-pay

## 2022-06-05 DIAGNOSIS — E039 Hypothyroidism, unspecified: Secondary | ICD-10-CM

## 2022-06-05 DIAGNOSIS — E785 Hyperlipidemia, unspecified: Secondary | ICD-10-CM

## 2022-06-05 DIAGNOSIS — E119 Type 2 diabetes mellitus without complications: Secondary | ICD-10-CM

## 2022-06-05 DIAGNOSIS — R030 Elevated blood-pressure reading, without diagnosis of hypertension: Secondary | ICD-10-CM

## 2022-06-05 NOTE — Telephone Encounter (Signed)
Patient made aware per drs orders. 

## 2022-06-12 DIAGNOSIS — E039 Hypothyroidism, unspecified: Secondary | ICD-10-CM | POA: Diagnosis not present

## 2022-06-12 DIAGNOSIS — E119 Type 2 diabetes mellitus without complications: Secondary | ICD-10-CM | POA: Diagnosis not present

## 2022-06-12 DIAGNOSIS — E785 Hyperlipidemia, unspecified: Secondary | ICD-10-CM | POA: Diagnosis not present

## 2022-06-12 DIAGNOSIS — R030 Elevated blood-pressure reading, without diagnosis of hypertension: Secondary | ICD-10-CM | POA: Diagnosis not present

## 2022-06-15 DIAGNOSIS — H04123 Dry eye syndrome of bilateral lacrimal glands: Secondary | ICD-10-CM | POA: Diagnosis not present

## 2022-06-15 DIAGNOSIS — Z961 Presence of intraocular lens: Secondary | ICD-10-CM | POA: Diagnosis not present

## 2022-06-15 LAB — LIPID PANEL
Chol/HDL Ratio: 2 ratio (ref 0.0–4.4)
Cholesterol, Total: 141 mg/dL (ref 100–199)
HDL: 70 mg/dL (ref >39)
LDL Chol Calc (NIH): 55 mg/dL (ref 0–99)
Triglycerides: 86 mg/dL (ref 0–149)
VLDL Cholesterol Cal: 16 mg/dL (ref 5–40)

## 2022-06-15 LAB — COMPREHENSIVE METABOLIC PANEL
ALT: 18 IU/L (ref 0–32)
AST: 19 IU/L (ref 0–40)
Albumin/Globulin Ratio: 1.5 (ref 1.2–2.2)
Albumin: 4.7 g/dL (ref 3.8–4.8)
Alkaline Phosphatase: 60 IU/L (ref 44–121)
BUN/Creatinine Ratio: 22 (ref 12–28)
BUN: 20 mg/dL (ref 8–27)
Bilirubin Total: 0.9 mg/dL (ref 0.0–1.2)
CO2: 22 mmol/L (ref 20–29)
Calcium: 9.6 mg/dL (ref 8.7–10.3)
Chloride: 101 mmol/L (ref 96–106)
Creatinine, Ser: 0.91 mg/dL (ref 0.57–1.00)
Globulin, Total: 3.1 g/dL (ref 1.5–4.5)
Glucose: 128 mg/dL — ABNORMAL HIGH (ref 70–99)
Potassium: 4.6 mmol/L (ref 3.5–5.2)
Sodium: 136 mmol/L (ref 134–144)
Total Protein: 7.8 g/dL (ref 6.0–8.5)
eGFR: 66 mL/min/{1.73_m2} (ref >59)

## 2022-06-15 LAB — CBC WITH DIFFERENTIAL/PLATELET
Basophils Absolute: 0 10*3/uL (ref 0.0–0.2)
Basos: 1 %
EOS (ABSOLUTE): 0 10*3/uL (ref 0.0–0.4)
Eos: 1 %
Hematocrit: 42 % (ref 34.0–46.6)
Hemoglobin: 13.7 g/dL (ref 11.1–15.9)
Immature Grans (Abs): 0 10*3/uL (ref 0.0–0.1)
Immature Granulocytes: 0 %
Lymphocytes Absolute: 3.7 10*3/uL — ABNORMAL HIGH (ref 0.7–3.1)
Lymphs: 56 %
MCH: 29.5 pg (ref 26.6–33.0)
MCHC: 32.6 g/dL (ref 31.5–35.7)
MCV: 91 fL (ref 79–97)
Monocytes Absolute: 1.7 10*3/uL — ABNORMAL HIGH (ref 0.1–0.9)
Monocytes: 26 %
Neutrophils Absolute: 1.1 10*3/uL — ABNORMAL LOW (ref 1.4–7.0)
Neutrophils: 16 %
Platelets: 109 10*3/uL — ABNORMAL LOW (ref 150–450)
RBC: 4.64 x10E6/uL (ref 3.77–5.28)
RDW: 12.2 % (ref 11.7–15.4)
WBC: 6.5 10*3/uL (ref 3.4–10.8)

## 2022-06-15 LAB — TSH: TSH: 2.28 u[IU]/mL (ref 0.450–4.500)

## 2022-06-15 LAB — HEMOGLOBIN A1C
Est. average glucose Bld gHb Est-mCnc: 143 mg/dL
Hgb A1c MFr Bld: 6.6 % — ABNORMAL HIGH (ref 4.8–5.6)

## 2022-06-15 LAB — MICROALBUMIN / CREATININE URINE RATIO
Creatinine, Urine: 131 mg/dL
Microalb/Creat Ratio: 5 mg/g creat (ref 0–29)
Microalbumin, Urine: 6 ug/mL

## 2022-06-16 ENCOUNTER — Telehealth: Payer: Self-pay | Admitting: Family Medicine

## 2022-06-16 NOTE — Telephone Encounter (Signed)
Contacted Morgan Hill to schedule their annual wellness visit. Appointment made for 06/19/2022.  Thank you,  Morgan Hill,  AMB Clinical Support [facility] AWV Program Direct Dial ??[phone]

## 2022-06-17 ENCOUNTER — Telehealth: Payer: Self-pay | Admitting: Family Medicine

## 2022-06-17 NOTE — Telephone Encounter (Signed)
Contacted [facility] to schedule their annual wellness visit. Patient declined to schedule AWV at this time. Patient was scheduled for AWV but called back to cancel.  She did not want to reschedule    Thank you,  Judeth Cornfield,  AMB Clinical Support [facility] AWV Program Direct Dial ??[phone]

## 2022-06-19 ENCOUNTER — Ambulatory Visit: Payer: Medicare HMO

## 2022-06-25 ENCOUNTER — Ambulatory Visit (INDEPENDENT_AMBULATORY_CARE_PROVIDER_SITE_OTHER): Payer: Medicare HMO | Admitting: Family Medicine

## 2022-06-25 DIAGNOSIS — E119 Type 2 diabetes mellitus without complications: Secondary | ICD-10-CM | POA: Diagnosis not present

## 2022-06-25 DIAGNOSIS — K219 Gastro-esophageal reflux disease without esophagitis: Secondary | ICD-10-CM

## 2022-06-25 DIAGNOSIS — R42 Dizziness and giddiness: Secondary | ICD-10-CM | POA: Diagnosis not present

## 2022-06-25 DIAGNOSIS — F321 Major depressive disorder, single episode, moderate: Secondary | ICD-10-CM

## 2022-06-25 DIAGNOSIS — D696 Thrombocytopenia, unspecified: Secondary | ICD-10-CM

## 2022-06-25 DIAGNOSIS — E785 Hyperlipidemia, unspecified: Secondary | ICD-10-CM | POA: Diagnosis not present

## 2022-06-25 DIAGNOSIS — E039 Hypothyroidism, unspecified: Secondary | ICD-10-CM | POA: Diagnosis not present

## 2022-06-25 MED ORDER — LEVOTHYROXINE SODIUM 50 MCG PO TABS: 50.0000 ug | ORAL_TABLET | Freq: Every day | ORAL | 1 refills | Status: DC

## 2022-06-25 MED ORDER — CITALOPRAM HYDROBROMIDE 10 MG PO TABS: 10.0000 mg | ORAL_TABLET | Freq: Every day | ORAL | 1 refills | Status: DC

## 2022-06-25 MED ORDER — MECLIZINE HCL 25 MG PO TABS
25.0000 mg | ORAL_TABLET | Freq: Three times a day (TID) | ORAL | 1 refills | Status: DC | PRN
Start: 2022-06-25 — End: 2023-09-28

## 2022-06-25 MED ORDER — ROSUVASTATIN CALCIUM 10 MG PO TABS: 10.0000 mg | ORAL_TABLET | Freq: Every day | ORAL | 1 refills | Status: DC

## 2022-06-25 MED ORDER — DICLOFENAC SODIUM 75 MG PO TBEC: 75.0000 mg | DELAYED_RELEASE_TABLET | Freq: Two times a day (BID) | ORAL | 1 refills | Status: AC | PRN

## 2022-06-25 MED ORDER — PANTOPRAZOLE SODIUM 40 MG PO TBEC: 40.0000 mg | DELAYED_RELEASE_TABLET | Freq: Every day | ORAL | 1 refills | Status: DC

## 2022-06-25 NOTE — Assessment & Plan Note (Signed)
Repeating CBC with differential.

## 2022-06-25 NOTE — Assessment & Plan Note (Signed)
Stable.  Continue current dosing of Synthroid. 

## 2022-06-25 NOTE — Progress Notes (Signed)
Subjective:  Patient ID: Morgan Hill, female    DOB: 1945/03/18  Age: 77 y.o. MRN: [id number]  CC: Chief Complaint  Patient presents with   Diabetes    Lab results and discuss platelets     HPI:  77 year old female with GERD, hypothyroidism, type 2 diabetes, obesity, hyperlipidemia presents for follow-up.  Patient's blood pressure is elevated here today.  Patient has recently had labs.  She would like to review these today. A1c is at goal.  She is not on any pharmacotherapy at this time.  She is working on her diet.  Lipids very well-controlled on Crestor.  Hypothyroidism stable on current Synthroid dosing.  Labs revealed thrombocytopenia and slightly low neutrophil count and elevated lymphocyte count and monocyte count.  We will discuss this today.  She is feeling well.  However she is nervous about the lab abnormalities.  Patient does note that she has been having some right shoulder pain.  She would like to restart diclofenac which she was previously prescribed.  Patient also states that she has had vertigo in the past and would like a refill of her meclizine.  Patient Active Problem List   Diagnosis Date Noted   Thrombocytopenia (HCC) 06/25/2022   Vertigo 06/25/2022   Elevated BP without diagnosis of hypertension 11/05/2021   Preventative health care 11/05/2021   Hyperlipidemia 04/24/2021   Hypothyroidism 08/19/2020   Depression 07/14/2014   Type 2 diabetes mellitus without complications (HCC) 11/02/2013   GERD (gastroesophageal reflux disease) 05/03/2013   Obesity 05/02/2013    Social Hx   Social History   Socioeconomic History   Marital status: Single    Spouse name: Not on file   Number of children: Not on file   Years of education: Not on file   Highest education level: 12th grade  Occupational History   Not on file  Tobacco Use   Smoking status: Former    Types: Cigarettes    Quit date: 02/24/1995    Years since quitting: 27.3   Smokeless tobacco: Former     Quit date: 01/11/1996  Vaping Use   Vaping Use: Never used  Substance and Sexual Activity   Alcohol use: Yes    Alcohol/week: 1.0 standard drink of alcohol    Types: 1 Glasses of wine per week    Comment: occasional   Drug use: No   Sexual activity: Not on file  Other Topics Concern   Not on file  Social History Narrative   Not on file   Social Determinants of Health   Financial Resource Strain: Low Risk  (06/22/2022)   Overall Financial Resource Strain (CARDIA)    Difficulty of Paying Living Expenses: Not very hard  Food Insecurity: No Food Insecurity (06/22/2022)   Hunger Vital Sign    Worried About Running Out of Food in the Last Year: Never true    Ran Out of Food in the Last Year: Never true  Transportation Needs: No Transportation Needs (06/22/2022)   PRAPARE - Administrator, Civil Service (Medical): No    Lack of Transportation (Non-Medical): No  Physical Activity: Sufficiently Active (06/22/2022)   Exercise Vital Sign    Days of Exercise per Week: 3 days    Minutes of Exercise per Session: 60 min  Stress: Stress Concern Present (06/22/2022)   [employer] of Occupational Health - Occupational Stress Questionnaire    Feeling of Stress : To some extent  Social Connections: Unknown (06/22/2022)   Social Connection and Isolation  Panel [NHANES]    Frequency of Communication with Friends and Family: Not on file    Frequency of Social Gatherings with Friends and Family: Twice a week    Attends Religious Services: Patient declined    Database administrator or Organizations: No    Attends Engineer, structural: Not on file    Marital Status: Divorced    Review of Systems  Respiratory: Negative.    Cardiovascular: Negative.   Psychiatric/Behavioral:  The patient is nervous/anxious.    Per HPI  Objective:  BP (!) 158/74   Pulse 76   Temp 97.9 F (36.6 C)   Ht 5\' 4"  (1.626 m)   Wt 200 lb (90.7 kg)   SpO2 98%   BMI 34.33 kg/m       06/25/2022    8:38 AM 11/05/2021    8:54 AM 11/05/2021    8:40 AM  BP/Weight  Systolic BP 158 165 150  Diastolic BP 74 80 81  Wt. (Lbs) 200  202  BMI 34.33 kg/m2  34.67 kg/m2    Physical Exam Vitals and nursing note reviewed.  Constitutional:      Appearance: Normal appearance. She is obese.  Cardiovascular:     Rate and Rhythm: Normal rate and regular rhythm.  Pulmonary:     Effort: Pulmonary effort is normal.     Breath sounds: Normal breath sounds. No wheezing, rhonchi or rales.  Neurological:     Mental Status: She is alert.  Psychiatric:        Mood and Affect: Mood normal.        Behavior: Behavior normal.     Lab Results  Component Value Date   WBC 6.5 06/12/2022   HGB 13.7 06/12/2022   HCT 42.0 06/12/2022   PLT 109 (L) 06/12/2022   GLUCOSE 128 (H) 06/12/2022   CHOL 141 06/12/2022   TRIG 86 06/12/2022   HDL 70 06/12/2022   LDLCALC 55 06/12/2022   ALT 18 06/12/2022   AST 19 06/12/2022   NA 136 06/12/2022   K 4.6 06/12/2022   CL 101 06/12/2022   CREATININE 0.91 06/12/2022   BUN 20 06/12/2022   CO2 22 06/12/2022   TSH 2.280 06/12/2022   HGBA1C 6.6 (H) 06/12/2022     Assessment & Plan:   Problem List Items Addressed This Visit       Digestive   GERD (gastroesophageal reflux disease)   Relevant Medications   pantoprazole (PROTONIX) 40 MG tablet   meclizine (ANTIVERT) 25 MG tablet     Endocrine   Hypothyroidism    Stable.  Continue current dosing of Synthroid.      Relevant Medications   levothyroxine (SYNTHROID) 50 MCG tablet   Type 2 diabetes mellitus without complications (HCC)    At goal.      Relevant Medications   rosuvastatin (CRESTOR) 10 MG tablet     Hematopoietic and Hemostatic   Thrombocytopenia (HCC)    Repeating CBC with differential.      Relevant Orders   CBC with Differential     Other   Depression   Relevant Medications   citalopram (CELEXA) 10 MG tablet   Hyperlipidemia    At goal.  Continue Crestor.       Relevant Medications   rosuvastatin (CRESTOR) 10 MG tablet   Vertigo    Meclizine refilled.      Relevant Medications   meclizine (ANTIVERT) 25 MG tablet    Meds ordered this encounter  Medications  citalopram (CELEXA) 10 MG tablet    Sig: Take 1 tablet (10 mg total) by mouth daily.    Dispense:  90 tablet    Refill:  1   levothyroxine (SYNTHROID) 50 MCG tablet    Sig: Take 1 tablet (50 mcg total) by mouth daily.    Dispense:  90 tablet    Refill:  1   pantoprazole (PROTONIX) 40 MG tablet    Sig: Take 1 tablet (40 mg total) by mouth daily.    Dispense:  90 tablet    Refill:  1    DX Code Needed  .   rosuvastatin (CRESTOR) 10 MG tablet    Sig: Take 1 tablet (10 mg total) by mouth daily.    Dispense:  90 tablet    Refill:  1   meclizine (ANTIVERT) 25 MG tablet    Sig: Take 1 tablet (25 mg total) by mouth 3 (three) times daily as needed for dizziness.    Dispense:  30 tablet    Refill:  1   diclofenac (VOLTAREN) 75 MG EC tablet    Sig: Take 1 tablet (75 mg total) by mouth 2 (two) times daily as needed for moderate pain.    Dispense:  30 tablet    Refill:  1    Follow-up:  3-6 months (pending repeat CBC).   Everlene Other DO [facility]

## 2022-06-25 NOTE — Assessment & Plan Note (Signed)
At goal.  

## 2022-06-25 NOTE — Assessment & Plan Note (Signed)
At goal.  Continue Crestor. 

## 2022-06-25 NOTE — Assessment & Plan Note (Signed)
Meclizine refilled.

## 2022-06-25 NOTE — Patient Instructions (Signed)
Lab later this month.  Follow up in 3-6 months pending the lab results.  Take care  Dr. Adriana Simas

## 2022-07-17 DIAGNOSIS — D696 Thrombocytopenia, unspecified: Secondary | ICD-10-CM | POA: Diagnosis not present

## 2022-07-18 LAB — CBC WITH DIFFERENTIAL/PLATELET
Basophils Absolute: 0 10*3/uL (ref 0.0–0.2)
Basos: 1 %
EOS (ABSOLUTE): 0.1 10*3/uL (ref 0.0–0.4)
Eos: 1 %
Hematocrit: 40.1 % (ref 34.0–46.6)
Hemoglobin: 13.4 g/dL (ref 11.1–15.9)
Immature Grans (Abs): 0 10*3/uL (ref 0.0–0.1)
Immature Granulocytes: 1 %
Lymphocytes Absolute: 3.2 10*3/uL — ABNORMAL HIGH (ref 0.7–3.1)
Lymphs: 47 %
MCH: 29.8 pg (ref 26.6–33.0)
MCHC: 33.4 g/dL (ref 31.5–35.7)
MCV: 89 fL (ref 79–97)
Monocytes Absolute: 2 10*3/uL — ABNORMAL HIGH (ref 0.1–0.9)
Monocytes: 30 %
Neutrophils Absolute: 1.4 10*3/uL (ref 1.4–7.0)
Neutrophils: 20 %
Platelets: 101 10*3/uL — ABNORMAL LOW (ref 150–450)
RBC: 4.5 x10E6/uL (ref 3.77–5.28)
RDW: 12.1 % (ref 11.7–15.4)
WBC: 6.7 10*3/uL (ref 3.4–10.8)

## 2022-07-22 NOTE — Telephone Encounter (Signed)
Patient stated she agree to proceed with referral patient Also wants to know when to follow up - has appt scheduled 8/2 but said she was told it depending on results

## 2022-07-24 ENCOUNTER — Ambulatory Visit (INDEPENDENT_AMBULATORY_CARE_PROVIDER_SITE_OTHER): Payer: Medicare HMO | Admitting: Family Medicine

## 2022-07-24 VITALS — BP 166/70 | HR 70 | Temp 98.5°F | Ht 64.0 in | Wt 201.0 lb

## 2022-07-24 DIAGNOSIS — J321 Chronic frontal sinusitis: Secondary | ICD-10-CM | POA: Diagnosis not present

## 2022-07-24 DIAGNOSIS — D696 Thrombocytopenia, unspecified: Secondary | ICD-10-CM | POA: Diagnosis not present

## 2022-07-24 MED ORDER — FAMOTIDINE 20 MG PO TABS
20.0000 mg | ORAL_TABLET | Freq: Two times a day (BID) | ORAL | 1 refills | Status: DC
Start: 2022-07-24 — End: 2023-01-14

## 2022-07-24 MED ORDER — AMOXICILLIN-POT CLAVULANATE 875-125 MG PO TABS: 1.0000 | ORAL_TABLET | Freq: Two times a day (BID) | ORAL | 0 refills | Status: DC

## 2022-07-24 NOTE — Patient Instructions (Signed)
Stop protonix. Pepcid as directed.  Antibiotic as prescribed.

## 2022-07-26 DIAGNOSIS — J321 Chronic frontal sinusitis: Secondary | ICD-10-CM | POA: Insufficient documentation

## 2022-07-26 NOTE — Assessment & Plan Note (Signed)
Treating with Augmentin. 

## 2022-07-26 NOTE — Progress Notes (Signed)
Subjective:  Patient ID: Morgan Hill, female    DOB: April 11, 1945  Age: 77 y.o. MRN: [id number]  CC: Chief Complaint  Patient presents with   Nasal Congestion    Post nasal drainage / throat clearing, Ear discomfort, head pressure, used flonase and stopped going on for 1 month  Flares in spring and fall     HPI:  77 year old female presents for evaluation of the above.  Patient reports ongoing symptoms for the past month. Worsening recently. She reports congestion, post nasal drip, ear discomfort, headache/head pressure. She has been using flonase without resolution. No fever. No other complaints or concerns at this time.  Patient Active Problem List   Diagnosis Date Noted   Frontal sinusitis 07/26/2022   Thrombocytopenia (HCC) 06/25/2022   Vertigo 06/25/2022   Elevated BP without diagnosis of hypertension 11/05/2021   Preventative health care 11/05/2021   Hyperlipidemia 04/24/2021   Hypothyroidism 08/19/2020   Depression 07/14/2014   Type 2 diabetes mellitus without complications (HCC) 11/02/2013   GERD (gastroesophageal reflux disease) 05/03/2013   Obesity 05/02/2013    Social Hx   Social History   Socioeconomic History   Marital status: Single    Spouse name: Not on file   Number of children: Not on file   Years of education: Not on file   Highest education level: 12th grade  Occupational History   Not on file  Tobacco Use   Smoking status: Former    Types: Cigarettes    Quit date: 02/24/1995    Years since quitting: 27.4   Smokeless tobacco: Former    Quit date: 01/11/1996  Vaping Use   Vaping Use: Never used  Substance and Sexual Activity   Alcohol use: Yes    Alcohol/week: 1.0 standard drink of alcohol    Types: 1 Glasses of wine per week    Comment: occasional   Drug use: No   Sexual activity: Not on file  Other Topics Concern   Not on file  Social History Narrative   Not on file   Social Determinants of Health   Financial Resource Strain: Low  Risk  (06/22/2022)   Overall Financial Resource Strain (CARDIA)    Difficulty of Paying Living Expenses: Not very hard  Food Insecurity: No Food Insecurity (06/22/2022)   Hunger Vital Sign    Worried About Running Out of Food in the Last Year: Never true    Ran Out of Food in the Last Year: Never true  Transportation Needs: No Transportation Needs (06/22/2022)   PRAPARE - Administrator, Civil Service (Medical): No    Lack of Transportation (Non-Medical): No  Physical Activity: Sufficiently Active (06/22/2022)   Exercise Vital Sign    Days of Exercise per Week: 3 days    Minutes of Exercise per Session: 60 min  Stress: Stress Concern Present (06/22/2022)   [employer] of Occupational Health - Occupational Stress Questionnaire    Feeling of Stress : To some extent  Social Connections: Unknown (06/22/2022)   Social Connection and Isolation Panel [NHANES]    Frequency of Communication with Friends and Family: Not on file    Frequency of Social Gatherings with Friends and Family: Twice a week    Attends Religious Services: Patient declined    Database administrator or Organizations: No    Attends Engineer, structural: Not on file    Marital Status: Divorced    Review of Systems Per HPI  Objective:  BP Marland Kitchen)  166/70   Pulse 70   Temp 98.5 F (36.9 C)   Ht 5\' 4"  (1.626 m)   Wt 201 lb (91.2 kg)   SpO2 97%   BMI 34.50 kg/m      07/24/2022    3:34 PM 06/25/2022    8:38 AM 11/05/2021    8:54 AM  BP/Weight  Systolic BP 166 158 165  Diastolic BP 70 74 80  Wt. (Lbs) 201 200   BMI 34.5 kg/m2 34.33 kg/m2     Physical Exam Vitals and nursing note reviewed.  Constitutional:      General: She is not in acute distress.    Appearance: Normal appearance.  HENT:     Head: Normocephalic and atraumatic.     Right Ear: Tympanic membrane normal.     Left Ear: Tympanic membrane normal.     Nose:     Right Sinus: Frontal sinus tenderness present.     Left Sinus:  Frontal sinus tenderness present.     Mouth/Throat:     Pharynx: Oropharynx is clear.  Cardiovascular:     Rate and Rhythm: Normal rate and regular rhythm.  Pulmonary:     Effort: Pulmonary effort is normal.     Breath sounds: Normal breath sounds.  Neurological:     Mental Status: She is alert.     Lab Results  Component Value Date   WBC 6.7 07/17/2022   HGB 13.4 07/17/2022   HCT 40.1 07/17/2022   PLT 101 (L) 07/17/2022   GLUCOSE 128 (H) 06/12/2022   CHOL 141 06/12/2022   TRIG 86 06/12/2022   HDL 70 06/12/2022   LDLCALC 55 06/12/2022   ALT 18 06/12/2022   AST 19 06/12/2022   NA 136 06/12/2022   K 4.6 06/12/2022   CL 101 06/12/2022   CREATININE 0.91 06/12/2022   BUN 20 06/12/2022   CO2 22 06/12/2022   TSH 2.280 06/12/2022   HGBA1C 6.6 (H) 06/12/2022     Assessment & Plan:   Problem List Items Addressed This Visit       Respiratory   Frontal sinusitis - Primary    Treating with Augmentin.      Relevant Medications   amoxicillin-clavulanate (AUGMENTIN) 875-125 MG tablet     Hematopoietic and Hemostatic   Thrombocytopenia (HCC)   Relevant Orders   Ambulatory referral to Hematology / Oncology    Meds ordered this encounter  Medications   amoxicillin-clavulanate (AUGMENTIN) 875-125 MG tablet    Sig: Take 1 tablet by mouth 2 (two) times daily.    Dispense:  14 tablet    Refill:  0   famotidine (PEPCID) 20 MG tablet    Sig: Take 1 tablet (20 mg total) by mouth 2 (two) times daily.    Dispense:  180 tablet    Refill:  1    Pierson Vantol DO [facility]

## 2022-08-04 ENCOUNTER — Inpatient Hospital Stay: Payer: Medicare HMO | Attending: Hematology | Admitting: Hematology

## 2022-08-04 ENCOUNTER — Inpatient Hospital Stay: Payer: Medicare HMO

## 2022-08-04 VITALS — BP 171/62 | HR 71 | Temp 98.1°F | Resp 18 | Wt 200.4 lb

## 2022-08-04 DIAGNOSIS — D696 Thrombocytopenia, unspecified: Secondary | ICD-10-CM

## 2022-08-04 DIAGNOSIS — Z87891 Personal history of nicotine dependence: Secondary | ICD-10-CM | POA: Insufficient documentation

## 2022-08-04 DIAGNOSIS — Z79899 Other long term (current) drug therapy: Secondary | ICD-10-CM | POA: Insufficient documentation

## 2022-08-04 LAB — CBC WITH DIFFERENTIAL/PLATELET
Abs Immature Granulocytes: 0.05 10*3/uL (ref 0.00–0.07)
Basophils Absolute: 0 10*3/uL (ref 0.0–0.1)
Basophils Relative: 0 %
Eosinophils Absolute: 0 10*3/uL (ref 0.0–0.5)
Eosinophils Relative: 0 %
HCT: 41.4 % (ref 36.0–46.0)
Hemoglobin: 13.1 g/dL (ref 12.0–15.0)
Immature Granulocytes: 1 %
Lymphocytes Relative: 45 %
Lymphs Abs: 3.4 10*3/uL (ref 0.7–4.0)
MCH: 29 pg (ref 26.0–34.0)
MCHC: 31.6 g/dL (ref 30.0–36.0)
MCV: 91.8 fL (ref 80.0–100.0)
Monocytes Absolute: 2.2 10*3/uL — ABNORMAL HIGH (ref 0.1–1.0)
Monocytes Relative: 29 %
Neutro Abs: 1.9 10*3/uL (ref 1.7–7.7)
Neutrophils Relative %: 25 %
Platelets: 121 10*3/uL — ABNORMAL LOW (ref 150–400)
RBC: 4.51 MIL/uL (ref 3.87–5.11)
RDW: 13 % (ref 11.5–15.5)
WBC: 7.7 10*3/uL (ref 4.0–10.5)
nRBC: 0 % (ref 0.0–0.2)

## 2022-08-04 LAB — FERRITIN: Ferritin: 70 ng/mL (ref 11–307)

## 2022-08-04 LAB — IRON AND TIBC
Iron: 92 ug/dL (ref 28–170)
Saturation Ratios: 21 % (ref 10.4–31.8)
TIBC: 429 ug/dL (ref 250–450)
UIBC: 337 ug/dL

## 2022-08-04 LAB — HEPATITIS B SURFACE ANTIBODY,QUALITATIVE: Hep B S Ab: NONREACTIVE

## 2022-08-04 LAB — SEDIMENTATION RATE: Sed Rate: 8 mm/hr (ref 0–22)

## 2022-08-04 LAB — VITAMIN B12: Vitamin B-12: 523 pg/mL (ref 180–914)

## 2022-08-04 LAB — HEPATITIS B CORE ANTIBODY, TOTAL: Hep B Core Total Ab: NONREACTIVE

## 2022-08-04 LAB — HEPATITIS C ANTIBODY: HCV Ab: NONREACTIVE

## 2022-08-04 LAB — C-REACTIVE PROTEIN: CRP: 0.6 mg/dL (ref <1.0)

## 2022-08-04 LAB — FOLATE: Folate: 18.3 ng/mL (ref >5.9)

## 2022-08-04 LAB — HEPATITIS B SURFACE ANTIGEN: Hepatitis B Surface Ag: NONREACTIVE

## 2022-08-04 LAB — IMMATURE PLATELET FRACTION: Immature Platelet Fraction: 25.9 % — ABNORMAL HIGH (ref 1.2–8.6)

## 2022-08-04 NOTE — Progress Notes (Signed)
[facility] [address], [place] [address]   Clinic Day:  08/04/2022  Referring physician: Tommie Sams, DO  Patient Care Team: Tommie Sams, DO as PCP - General (Family Medicine)   ASSESSMENT & PLAN:   Assessment:  1.  Mild thrombocytopenia: - Patient seen at the request of Dr. Adriana Simas. - PLT-109 (06/12/2022), 101 (07/17/2022) - She reports symptoms of sinus infection for few weeks prior to 07/24/2022 when she was prescribed Augmentin for 7 days. - Pantoprazole was discontinued and famotidine was started on 07/24/2022.  No other new medications.  Denies taking any quinine/tonic water.  She has been on Celexa for 13 to 14 years and Crestor for approximately 1 year and Synthroid for 2 years. - She has noticed mild easy bruising on the legs.  2.  Social/family history: - She lives by herself at home and is independent of ADLs and IADLs.  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  Quit smoking in 1997.  She smoked 1 pack/day for 30 years.  No direct chemical exposure. - No family history of thrombocytopenia or malignancies.  Plan:  1.  Mild thrombocytopenia: - Differential on 07/17/2022 shows lymphocytosis and monocytosis.  Also has mild neutropenia.  Likely related to recent infection. - Will repeat CBC with differential.  Will check immature platelet fraction, LDH and reticulocyte count.  Will check nutritional deficiencies, connective tissue disorders and infectious etiologies and bone marrow infiltrative process. - Will hold the tube for flow cytometry.  If there are any abnormal cells on differential, will send it for follow-up. - RTC 1 to 2 weeks for follow-up.   Orders Placed This Encounter  Procedures   Immature Platelet Fraction    Standing Status:   Future    Number of Occurrences:   1    Standing Expiration Date:   08/04/2023   CBC with Differential    Standing Status:   Future    Number of Occurrences:    1    Standing Expiration Date:   08/04/2023   Hepatitis B surface antibody    Standing Status:   Future    Number of Occurrences:   1    Standing Expiration Date:   08/04/2023   Hepatitis B surface antigen    Standing Status:   Future    Number of Occurrences:   1    Standing Expiration Date:   08/04/2023   Hepatitis C Antibody    Standing Status:   Future    Number of Occurrences:   1    Standing Expiration Date:   08/04/2023   Hepatitis B core antibody, total    Standing Status:   Future    Number of Occurrences:   1    Standing Expiration Date:   08/04/2023   Copper, serum    Standing Status:   Future    Number of Occurrences:   1    Standing Expiration Date:   08/04/2023   Vitamin B12    Standing Status:   Future    Number of Occurrences:   1    Standing Expiration Date:   08/04/2023   Folate    Standing Status:   Future    Number of Occurrences:   1    Standing Expiration Date:   08/04/2023   Ferritin    Standing Status:   Future    Number of Occurrences:   1  Standing Expiration Date:   08/04/2023   Iron and TIBC ([facility] ONLY)    Standing Status:   Future    Number of Occurrences:   1    Standing Expiration Date:   08/04/2023   Methylmalonic acid, serum    Standing Status:   Future    Number of Occurrences:   1    Standing Expiration Date:   08/04/2023   Sedimentation rate    Standing Status:   Future    Number of Occurrences:   1    Standing Expiration Date:   08/04/2023   C-reactive protein    Standing Status:   Future    Number of Occurrences:   1    Standing Expiration Date:   08/04/2023   Kappa/lambda light chains    Standing Status:   Future    Number of Occurrences:   1    Standing Expiration Date:   08/04/2023   Protein electrophoresis, serum    Standing Status:   Future    Number of Occurrences:   1    Standing Expiration Date:   08/04/2023   Immunofixation electrophoresis    Standing Status:   Future    Number of Occurrences:   1     Standing Expiration Date:   08/04/2023   ANA, IFA (with reflex)    Standing Status:   Future    Number of Occurrences:   1    Standing Expiration Date:   08/04/2023   Rheumatoid factor    Standing Status:   Future    Number of Occurrences:   1    Standing Expiration Date:   08/04/2023      I,Katie Daubenspeck,acting as a scribe for Doreatha Massed, MD.,have documented all relevant documentation on the behalf of Doreatha Massed, MD,as directed by  Doreatha Massed, MD while in the presence of Doreatha Massed, MD.   I, Doreatha Massed MD, have reviewed the above documentation for accuracy and completeness, and I agree with the above.   Doreatha Massed, MD   6/11/20245:43 PM  CHIEF COMPLAINT/PURPOSE OF CONSULT:   Diagnosis: low platelet count  Current Therapy: Under workup  HISTORY OF PRESENT ILLNESS:   Morgan Hill is a 77 y.o. female presenting to clinic today for evaluation of low platelet count at the request of Dr. Adriana Simas.  She was found to have abnormal CBC from 06/12/22, with a platelet count of 109k. This was repeated recently on 07/17/22 showing persistently low platelets at 101k. The remainder of her CBC has been WNL.  Today, she states that she is doing well overall. Her appetite level is at 100%. Her energy level is at 80%.  PAST MEDICAL HISTORY:   Past Medical History: Past Medical History:  Diagnosis Date   Back pain    CTS (carpal tunnel syndrome)    right   GERD (gastroesophageal reflux disease)     Surgical History: Past Surgical History:  Procedure Laterality Date   CATARACT EXTRACTION BILATERAL W/ ANTERIOR VITRECTOMY Bilateral    03/27/01 right 05/15/21 left    Social History: Social History   Socioeconomic History   Marital status: Single    Spouse name: Not on file   Number of children: Not on file   Years of education: Not on file   Highest education level: 12th grade  Occupational History   Not on file  Tobacco Use   Smoking  status: Former    Types: Cigarettes    Quit date: 02/24/1995  Years since quitting: 27.4   Smokeless tobacco: Former    Quit date: 01/11/1996  Vaping Use   Vaping Use: Never used  Substance and Sexual Activity   Alcohol use: Yes    Alcohol/week: 1.0 standard drink of alcohol    Types: 1 Glasses of wine per week    Comment: occasional   Drug use: No   Sexual activity: Not on file  Other Topics Concern   Not on file  Social History Narrative   Not on file   Social Determinants of Health   Financial Resource Strain: Low Risk  (06/22/2022)   Overall Financial Resource Strain (CARDIA)    Difficulty of Paying Living Expenses: Not very hard  Food Insecurity: No Food Insecurity (06/22/2022)   Hunger Vital Sign    Worried About Running Out of Food in the Last Year: Never true    Ran Out of Food in the Last Year: Never true  Transportation Needs: No Transportation Needs (06/22/2022)   PRAPARE - Administrator, Civil Service (Medical): No    Lack of Transportation (Non-Medical): No  Physical Activity: Sufficiently Active (06/22/2022)   Exercise Vital Sign    Days of Exercise per Week: 3 days    Minutes of Exercise per Session: 60 min  Stress: Stress Concern Present (06/22/2022)   [employer] of Occupational Health - Occupational Stress Questionnaire    Feeling of Stress : To some extent  Social Connections: Unknown (06/22/2022)   Social Connection and Isolation Panel [NHANES]    Frequency of Communication with Friends and Family: Not on file    Frequency of Social Gatherings with Friends and Family: Twice a week    Attends Religious Services: Patient declined    Database administrator or Organizations: No    Attends Engineer, structural: Not on file    Marital Status: Divorced  Catering manager Violence: Not on file    Family History: Family History  Problem Relation Age of Onset   Hypertension Mother    Diabetes Mother    Heart disease Mother      Current Medications:  Current Outpatient Medications:    amoxicillin-clavulanate (AUGMENTIN) 875-125 MG tablet, Take 1 tablet by mouth 2 (two) times daily., Disp: 14 tablet, Rfl: 0   citalopram (CELEXA) 10 MG tablet, Take 1 tablet (10 mg total) by mouth daily., Disp: 90 tablet, Rfl: 1   diclofenac (VOLTAREN) 75 MG EC tablet, Take 1 tablet (75 mg total) by mouth 2 (two) times daily as needed for moderate pain., Disp: 30 tablet, Rfl: 1   famotidine (PEPCID) 20 MG tablet, Take 1 tablet (20 mg total) by mouth 2 (two) times daily., Disp: 180 tablet, Rfl: 1   levothyroxine (SYNTHROID) 50 MCG tablet, Take 1 tablet (50 mcg total) by mouth daily., Disp: 90 tablet, Rfl: 1   meclizine (ANTIVERT) 25 MG tablet, Take 1 tablet (25 mg total) by mouth 3 (three) times daily as needed for dizziness., Disp: 30 tablet, Rfl: 1   Propylene Glycol (SYSTANE BALANCE OP), Apply 1 drop to eye in the morning, at noon, and at bedtime. Dry eye, Disp: , Rfl:    rosuvastatin (CRESTOR) 10 MG tablet, Take 1 tablet (10 mg total) by mouth daily., Disp: 90 tablet, Rfl: 1   Allergies: No Known Allergies  REVIEW OF SYSTEMS:   Review of Systems  Constitutional:  Negative for chills, fatigue and fever.  HENT:   Negative for lump/mass, mouth sores, nosebleeds, sore throat and trouble swallowing.  Eyes:  Negative for eye problems.  Respiratory:  Positive for shortness of breath. Negative for cough.   Cardiovascular:  Negative for chest pain, leg swelling and palpitations.  Gastrointestinal:  Negative for abdominal pain, constipation, diarrhea, nausea and vomiting.  Genitourinary:  Negative for bladder incontinence, difficulty urinating, dysuria, frequency, hematuria and nocturia.   Musculoskeletal:  Negative for arthralgias, back pain, flank pain, myalgias and neck pain.  Skin:  Negative for itching and rash.  Neurological:  Positive for dizziness. Negative for headaches and numbness.  Hematological:  Does not bruise/bleed  easily.  Psychiatric/Behavioral:  Positive for depression. Negative for sleep disturbance and suicidal ideas. The patient is nervous/anxious.   All other systems reviewed and are negative.    VITALS:   Blood pressure (!) 171/62, pulse 71, temperature 98.1 F (36.7 C), temperature source Oral, resp. rate 18, weight 200 lb 6.4 oz (90.9 kg), SpO2 96 %.  Wt Readings from Last 3 Encounters:  08/04/22 200 lb 6.4 oz (90.9 kg)  07/24/22 201 lb (91.2 kg)  06/25/22 200 lb (90.7 kg)    Body mass index is 34.4 kg/m.   PHYSICAL EXAM:   Physical Exam Vitals and nursing note reviewed. Exam conducted with a chaperone present.  Constitutional:      Appearance: Normal appearance.  Cardiovascular:     Rate and Rhythm: Normal rate and regular rhythm.     Pulses: Normal pulses.     Heart sounds: Normal heart sounds.  Pulmonary:     Effort: Pulmonary effort is normal.     Breath sounds: Normal breath sounds.  Abdominal:     Palpations: Abdomen is soft. There is no hepatomegaly, splenomegaly or mass.     Tenderness: There is no abdominal tenderness.  Musculoskeletal:     Right lower leg: No edema.     Left lower leg: No edema.  Lymphadenopathy:     Cervical: No cervical adenopathy.     Right cervical: No superficial, deep or posterior cervical adenopathy.    Left cervical: No superficial, deep or posterior cervical adenopathy.     Upper Body:     Right upper body: No supraclavicular or axillary adenopathy.     Left upper body: No supraclavicular or axillary adenopathy.  Neurological:     General: No focal deficit present.     Mental Status: She is alert and oriented to person, place, and time.  Psychiatric:        Mood and Affect: Mood normal.        Behavior: Behavior normal.     LABS:      Latest Ref Rng & Units 08/04/2022    2:14 PM 07/17/2022   10:48 AM 06/12/2022   10:09 AM  CBC  WBC 4.0 - 10.5 K/uL 7.7  6.7  6.5   Hemoglobin 12.0 - 15.0 g/dL 16.1  09.6  04.5   Hematocrit  36.0 - 46.0 % 41.4  40.1  42.0   Platelets 150 - 400 K/uL 121  101  109       Latest Ref Rng & Units 06/12/2022   10:09 AM 11/05/2021    9:23 AM 04/24/2021   10:40 AM  CMP  Glucose 70 - 99 mg/dL 409  811  914   BUN 8 - 27 mg/dL 20  11  18    Creatinine 0.57 - 1.00 mg/dL 7.82  9.56  2.13   Sodium 134 - 144 mmol/L 136  139  137   Potassium 3.5 - 5.2 mmol/L 4.6  4.7  4.6   Chloride 96 - 106 mmol/L 101  103  102   CO2 20 - 29 mmol/L 22  23  22    Calcium 8.7 - 10.3 mg/dL 9.6  9.4  9.5   Total Protein 6.0 - 8.5 g/dL 7.8  7.7  7.9   Total Bilirubin 0.0 - 1.2 mg/dL 0.9  0.8  0.8   Alkaline Phos 44 - 121 IU/L 60  63  58   AST 0 - 40 IU/L 19  22  20    ALT 0 - 32 IU/L 18  21  21       No results found for: "CEA1", "CEA" / No results found for: "CEA1", "CEA" No results found for: "PSA1" No results found for: "ZOX096" No results found for: "CAN125"  No results found for: "TOTALPROTELP", "ALBUMINELP", "A1GS", "A2GS", "BETS", "BETA2SER", "GAMS", "MSPIKE", "SPEI" Lab Results  Component Value Date   TIBC 429 08/04/2022   FERRITIN 70 08/04/2022   IRONPCTSAT 21 08/04/2022   No results found for: "LDH"   STUDIES:   No results found.

## 2022-08-04 NOTE — Patient Instructions (Signed)
You were seen and examined today by Dr. Katragadda. Dr. Katragadda is a hematologist, meaning that he specializes in blood abnormalities. Dr. Katragadda discussed your past medical history, family history of cancers/blood conditions and the events that led to you being here today.  You were referred to Dr. Katragadda due to thrombocytopenia (low platelets).  Dr. Katragadda has recommended additional labs today for further evaluation.  Follow-up as scheduled.  

## 2022-08-05 LAB — RHEUMATOID FACTOR: Rheumatoid fact SerPl-aCnc: <10 IU/mL (ref <14.0)

## 2022-08-06 LAB — KAPPA/LAMBDA LIGHT CHAINS
Kappa free light chain: 29.1 mg/L — ABNORMAL HIGH (ref 3.3–19.4)
Kappa, lambda light chain ratio: 1.29 (ref 0.26–1.65)
Lambda free light chains: 22.6 mg/L (ref 5.7–26.3)

## 2022-08-06 LAB — COPPER, SERUM: Copper: 106 ug/dL (ref 80–158)

## 2022-08-07 LAB — PROTEIN ELECTROPHORESIS, SERUM
A/G Ratio: 1.1 (ref 0.7–1.7)
Albumin ELP: 3.9 g/dL (ref 2.9–4.4)
Alpha-1-Globulin: 0.3 g/dL (ref 0.0–0.4)
Alpha-2-Globulin: 0.6 g/dL (ref 0.4–1.0)
Beta Globulin: 1.2 g/dL (ref 0.7–1.3)
Gamma Globulin: 1.7 g/dL (ref 0.4–1.8)
Globulin, Total: 3.7 g/dL (ref 2.2–3.9)
Total Protein ELP: 7.6 g/dL (ref 6.0–8.5)

## 2022-08-07 LAB — ANTINUCLEAR ANTIBODIES, IFA: ANA Ab, IFA: NEGATIVE

## 2022-08-08 LAB — IMMUNOFIXATION ELECTROPHORESIS
IgA: 317 mg/dL (ref 64–422)
IgG (Immunoglobin G), Serum: 1528 mg/dL (ref 586–1602)
IgM (Immunoglobulin M), Srm: 146 mg/dL (ref 26–217)
Total Protein ELP: 7.6 g/dL (ref 6.0–8.5)

## 2022-08-09 LAB — METHYLMALONIC ACID, SERUM: Methylmalonic Acid, Quantitative: 180 nmol/L (ref 0–378)

## 2022-08-18 NOTE — Progress Notes (Addendum)
[facility] [address], [place] [address]   CLINIC:  Medical Oncology/Hematology  PCP:  Tommie Sams, DO [address] [place] [address] [phone]   REASON FOR VISIT:  Follow-up for thrombocytopenia  PRIOR THERAPY: None  CURRENT THERAPY: Under workup  INTERVAL HISTORY:   Morgan Hill 77 y.o. female returns for routine follow-up of thrombocytopenia.  She was seen for initial consultation by Dr. Ellin Saba on 08/04/2022.    At today's visit, she reports feeling fairly well.  No recent hospitalizations, surgeries, or changes in baseline health status.  She admits to easy bruising, but denies any petechial rash or bleeding events (such as melena, hematochezia, hematuria, gum bleeding, or nosebleeds).  She has not had any B symptoms such as fever, chills, night sweats, unintentional weight loss.  She denies any new masses or lymphadenopathy.  She has 75% energy and 100% appetite. She endorses that she is maintaining a stable weight.  ASSESSMENT & PLAN:  1.  Mild thrombocytopenia - Platelets normal through March 2023.  Onset of mild thrombocytopenia first noted in April 2024.  (Platelets 109 on 06/12/2022, platelets 101 on 07/17/2022). - She reports symptoms of sinus infection for few weeks prior to 07/24/2022 when she was prescribed Augmentin for 7 days. - Pantoprazole was discontinued and famotidine was started on 07/24/2022.  No other new medications.  She has been on Celexa for 13 to 14 years and Crestor for approximately 1 year and Synthroid for 2 years. - Denies taking any quinine/tonic water.  No history of autoimmune or connective tissue disorder.  No history of liver disease or blood transfusion. - She will drink 2 to 3 glasses of wine on 2 to 3 days of the week (most weekends) - Hematology workup (08/04/2022): Extremely elevated immature platelet fraction 25.9%  Normal iron, B12, MMA, copper, folate. Negative rheumatoid factor, ANA.  Normal  ESR, normal CRP Negative hepatitis B, hepatitis C Immunofixation and SPEP.  Mildly elevated kappa free light chain at 29.1, normal lambda 22.6, normal FLC ratio 1.29. - CBC/D (08/04/2022): Platelets 121, elevated monocytes 2.2 (monocytes also elevated at 1.7 on 06/12/2022, and at 2.0 on 07/17/2022). [Previously noted lymphocytosis and leukopenia from April/May 2024 have resolved, and are thought likely to have been related to her sinus infection at the time of testing.] - She reports mild easy bruising on lower extremities.   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  We discussed close surveillance versus bone marrow biopsy, and she has opted for close surveillance for the time being.  If platelets decreased to <100, would strongly recommend bone marrow biopsy at that point in time.  Could also consider abdominal imaging, depending on how her levels are trending. - We will go ahead and check NGS myeloid panel.    2.   Social/family history: - She lives by herself at home and is independent of ADLs and IADLs.  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  Quit smoking in 1997.  She smoked 1 pack/day for 30 years.  No direct chemical exposure. - No family history of thrombocytopenia or malignancies.  PLAN SUMMARY: >> Monthly CBC/D >> LDH + NGS myeloid panel with next labs draw >> Same days labs (CBC/D) + OFFICE visit in 3 months     REVIEW OF SYSTEMS:   Review  of Systems  Constitutional:  Negative for appetite change, chills, diaphoresis, fatigue, fever and unexpected weight change.  HENT:   Negative for lump/mass and nosebleeds.   Eyes:  Negative for eye problems.  Respiratory:  Negative for cough, hemoptysis and shortness of breath.   Cardiovascular:  Negative for chest pain, leg swelling and palpitations.  Gastrointestinal:  Negative  for abdominal pain, blood in stool, constipation, diarrhea, nausea and vomiting.  Genitourinary:  Negative for hematuria.   Skin: Negative.   Neurological:  Positive for dizziness (vertigo). Negative for headaches and light-headedness.  Hematological:  Does not bruise/bleed easily.     PHYSICAL EXAM:  ECOG PERFORMANCE STATUS: 0 - Asymptomatic  There were no vitals filed for this visit. There were no vitals filed for this visit. Physical Exam Constitutional:      Appearance: Normal appearance. She is obese.  Cardiovascular:     Heart sounds: Normal heart sounds.  Pulmonary:     Breath sounds: Normal breath sounds.  Neurological:     General: No focal deficit present.     Mental Status: Mental status is at baseline.  Psychiatric:        Behavior: Behavior normal. Behavior is cooperative.     PAST MEDICAL/SURGICAL HISTORY:  Past Medical History:  Diagnosis Date   Back pain    CTS (carpal tunnel syndrome)    right   GERD (gastroesophageal reflux disease)    Past Surgical History:  Procedure Laterality Date   CATARACT EXTRACTION BILATERAL W/ ANTERIOR VITRECTOMY Bilateral    03/27/01 right 05/15/21 left    SOCIAL HISTORY:  Social History   Socioeconomic History   Marital status: Single    Spouse name: Not on file   Number of children: Not on file   Years of education: Not on file   Highest education level: 12th grade  Occupational History   Not on file  Tobacco Use   Smoking status: Former    Types: Cigarettes    Quit date: 02/24/1995    Years since quitting: 27.4   Smokeless tobacco: Former    Quit date: 01/11/1996  Vaping Use   Vaping Use: Never used  Substance and Sexual Activity   Alcohol use: Yes    Alcohol/week: 1.0 standard drink of alcohol    Types: 1 Glasses of wine per week    Comment: occasional   Drug use: No   Sexual activity: Not on file  Other Topics Concern   Not on file  Social History Narrative   Not on file   Social Determinants of  Health   Financial Resource Strain: Low Risk  (06/22/2022)   Overall Financial Resource Strain (CARDIA)    Difficulty of Paying Living Expenses: Not very hard  Food Insecurity: No Food Insecurity (06/22/2022)   Hunger Vital Sign    Worried About Running Out of Food in the Last Year: Never true    Ran Out of Food in the Last Year: Never true  Transportation Needs: No Transportation Needs (06/22/2022)   PRAPARE - Administrator, Civil Service (Medical): No    Lack of Transportation (Non-Medical): No  Physical Activity: Sufficiently Active (06/22/2022)   Exercise Vital Sign    Days of Exercise per Week: 3 days    Minutes of Exercise per Session: 60 min  Stress: Stress Concern Present (06/22/2022)   [employer] of Occupational Health - Occupational Stress Questionnaire    Feeling of Stress : To some extent  Social Connections: Unknown (06/22/2022)  Social Advertising account executive [NHANES]    Frequency of Communication with Friends and Family: Not on file    Frequency of Social Gatherings with Friends and Family: Twice a week    Attends Religious Services: Patient declined    Database administrator or Organizations: No    Attends Engineer, structural: Not on file    Marital Status: Divorced  Catering manager Violence: Not on file    FAMILY HISTORY:  Family History  Problem Relation Age of Onset   Hypertension Mother    Diabetes Mother    Heart disease Mother     CURRENT MEDICATIONS:  Outpatient Encounter Medications as of 08/19/2022  Medication Sig   amoxicillin-clavulanate (AUGMENTIN) 875-125 MG tablet Take 1 tablet by mouth 2 (two) times daily.   citalopram (CELEXA) 10 MG tablet Take 1 tablet (10 mg total) by mouth daily.   diclofenac (VOLTAREN) 75 MG EC tablet Take 1 tablet (75 mg total) by mouth 2 (two) times daily as needed for moderate pain.   famotidine (PEPCID) 20 MG tablet Take 1 tablet (20 mg total) by mouth 2 (two) times daily.    levothyroxine (SYNTHROID) 50 MCG tablet Take 1 tablet (50 mcg total) by mouth daily.   meclizine (ANTIVERT) 25 MG tablet Take 1 tablet (25 mg total) by mouth 3 (three) times daily as needed for dizziness.   Propylene Glycol (SYSTANE BALANCE OP) Apply 1 drop to eye in the morning, at noon, and at bedtime. Dry eye   rosuvastatin (CRESTOR) 10 MG tablet Take 1 tablet (10 mg total) by mouth daily.   No facility-administered encounter medications on file as of 08/19/2022.    ALLERGIES:  No Known Allergies  LABORATORY DATA:  I have reviewed the labs as listed.  CBC    Component Value Date/Time   WBC 7.7 08/04/2022 1414   RBC 4.51 08/04/2022 1414   HGB 13.1 08/04/2022 1414   HGB 13.4 07/17/2022 1048   HCT 41.4 08/04/2022 1414   HCT 40.1 07/17/2022 1048   PLT 121 (L) 08/04/2022 1414   PLT 101 (L) 07/17/2022 1048   MCV 91.8 08/04/2022 1414   MCV 89 07/17/2022 1048   MCH 29.0 08/04/2022 1414   MCHC 31.6 08/04/2022 1414   RDW 13.0 08/04/2022 1414   RDW 12.1 07/17/2022 1048   LYMPHSABS 3.4 08/04/2022 1414   LYMPHSABS 3.2 (H) 07/17/2022 1048   MONOABS 2.2 (H) 08/04/2022 1414   EOSABS 0.0 08/04/2022 1414   EOSABS 0.1 07/17/2022 1048   BASOSABS 0.0 08/04/2022 1414   BASOSABS 0.0 07/17/2022 1048      Latest Ref Rng & Units 06/12/2022   10:09 AM 11/05/2021    9:23 AM 04/24/2021   10:40 AM  CMP  Glucose 70 - 99 mg/dL 409  811  914   BUN 8 - 27 mg/dL 20  11  18    Creatinine 0.57 - 1.00 mg/dL 7.82  9.56  2.13   Sodium 134 - 144 mmol/L 136  139  137   Potassium 3.5 - 5.2 mmol/L 4.6  4.7  4.6   Chloride 96 - 106 mmol/L 101  103  102   CO2 20 - 29 mmol/L 22  23  22    Calcium 8.7 - 10.3 mg/dL 9.6  9.4  9.5   Total Protein 6.0 - 8.5 g/dL 7.8  7.7  7.9   Total Bilirubin 0.0 - 1.2 mg/dL 0.9  0.8  0.8   Alkaline Phos 44 - 121 IU/L 60  63  58   AST 0 - 40 IU/L 19  22  20    ALT 0 - 32 IU/L 18  21  21      DIAGNOSTIC IMAGING:  I have independently reviewed the relevant imaging and discussed  with the patient.   WRAP UP:  All questions were answered. The patient knows to call the clinic with any problems, questions or concerns.  Medical decision making: Moderate  Time spent on visit: I spent 20 minutes counseling the patient face to face. The total time spent in the appointment was 30 minutes and more than 50% was on counseling.  Morgan Guadalajara, PA-C  08/19/22 2:32 PM

## 2022-08-19 ENCOUNTER — Encounter: Payer: Self-pay | Admitting: Physician Assistant

## 2022-08-19 ENCOUNTER — Inpatient Hospital Stay: Payer: Medicare HMO | Admitting: Physician Assistant

## 2022-08-19 ENCOUNTER — Inpatient Hospital Stay: Payer: Medicare HMO | Admitting: Hematology

## 2022-08-19 VITALS — BP 149/69 | HR 67 | Temp 97.9°F | Resp 18 | Wt 200.0 lb

## 2022-08-19 DIAGNOSIS — D72821 Monocytosis (symptomatic): Secondary | ICD-10-CM | POA: Diagnosis not present

## 2022-08-19 DIAGNOSIS — D696 Thrombocytopenia, unspecified: Secondary | ICD-10-CM | POA: Diagnosis not present

## 2022-08-19 DIAGNOSIS — Z79899 Other long term (current) drug therapy: Secondary | ICD-10-CM | POA: Diagnosis not present

## 2022-08-19 DIAGNOSIS — Z87891 Personal history of nicotine dependence: Secondary | ICD-10-CM | POA: Diagnosis not present

## 2022-08-19 NOTE — Patient Instructions (Addendum)
[facility] at [facility] **VISIT SUMMARY & IMPORTANT INSTRUCTIONS **   You were seen today by Rojelio Brenner PA-C for your low platelets.    THROMBOCYTOPENIA (low platelets): Your lab results did not show any obvious cause of low platelets.  It is possible that you may have an underlying bone marrow disorder.  We will check your blood counts once a month for the next three months.  I will also check a genetic panel to see if you have any mutations that would correlate with certain blood and bone disorders. We will see you for follow up in 3 months. If your labs get worse, we would recommend a bone marrow biopsy to help [place] find out exactly what is going on.  BONE MARROW BIOPSY: This would be scheduled at [facility] in [place] so that you could be given a mild sedative during the procedure.  Please see the attached handout for important information regarding bone marrow biopsy.   FOLLOW-UP APPOINTMENT: Office visit in 3 months  ** Thank you for trusting me with your healthcare!  I strive to provide all of my patients with quality care at each visit.  If you receive a survey for this visit, I would be so grateful to you for taking the time to provide feedback.  Thank you in advance!  ~ Trinton Prewitt                   Dr. Doreatha Massed   &   Rojelio Brenner, PA-C   - - - - - - - - - - - - - - - - - -    Thank you for choosing  [facility] at [facility] to provide your oncology and hematology care.  To afford each patient quality time with our provider, please arrive at least 15 minutes before your scheduled appointment time.   If you have a lab appointment with the Cancer Center please come in thru the Main Entrance and check in at the main information desk.  You need to re-schedule your appointment should you arrive 10 or more minutes late.  We strive to give you quality time with our providers, and arriving late affects you and  other patients whose appointments are after yours.  Also, if you no show three or more times for appointments you may be dismissed from the clinic at the providers discretion.     Again, thank you for choosing [facility].  Our hope is that these requests will decrease the amount of time that you wait before being seen by our physicians.       _____________________________________________________________  Should you have questions after your visit to [facility], please contact our office at [phone] and follow the prompts.  Our office hours are 8:00 a.m. and 4:30 p.m. Monday - Friday.  Please note that voicemails left after 4:00 p.m. may not be returned until the following business day.  We are closed weekends and major holidays.  You do have access to a nurse 24-7, just call the main number to the clinic [phone] and do not press any options, hold on the line and a nurse will answer the phone.    For prescription refill requests, have your pharmacy contact our office and allow 72 hours.

## 2022-09-17 ENCOUNTER — Inpatient Hospital Stay: Payer: Medicare HMO | Attending: Hematology

## 2022-09-17 DIAGNOSIS — D696 Thrombocytopenia, unspecified: Secondary | ICD-10-CM | POA: Insufficient documentation

## 2022-09-17 DIAGNOSIS — D72821 Monocytosis (symptomatic): Secondary | ICD-10-CM

## 2022-09-17 LAB — CBC WITH DIFFERENTIAL/PLATELET
Abs Immature Granulocytes: 0.05 10*3/uL (ref 0.00–0.07)
Basophils Absolute: 0 10*3/uL (ref 0.0–0.1)
Basophils Relative: 1 %
Eosinophils Absolute: 0 10*3/uL (ref 0.0–0.5)
Eosinophils Relative: 0 %
HCT: 40 % (ref 36.0–46.0)
Hemoglobin: 12.9 g/dL (ref 12.0–15.0)
Immature Granulocytes: 1 %
Lymphocytes Relative: 53 %
Lymphs Abs: 4.6 10*3/uL — ABNORMAL HIGH (ref 0.7–4.0)
MCH: 29.3 pg (ref 26.0–34.0)
MCHC: 32.3 g/dL (ref 30.0–36.0)
MCV: 90.7 fL (ref 80.0–100.0)
Monocytes Absolute: 2.5 10*3/uL — ABNORMAL HIGH (ref 0.1–1.0)
Monocytes Relative: 30 %
Neutro Abs: 1.3 10*3/uL — ABNORMAL LOW (ref 1.7–7.7)
Neutrophils Relative %: 15 %
Platelets: 129 10*3/uL — ABNORMAL LOW (ref 150–400)
RBC: 4.41 MIL/uL (ref 3.87–5.11)
RDW: 13 % (ref 11.5–15.5)
WBC: 8.5 10*3/uL (ref 4.0–10.5)
nRBC: 0 % (ref 0.0–0.2)

## 2022-09-17 LAB — LACTATE DEHYDROGENASE: LDH: 174 U/L (ref 98–192)

## 2022-09-20 ENCOUNTER — Other Ambulatory Visit: Payer: Self-pay | Admitting: Family Medicine

## 2022-09-20 DIAGNOSIS — E039 Hypothyroidism, unspecified: Secondary | ICD-10-CM

## 2022-09-25 ENCOUNTER — Ambulatory Visit: Payer: Medicare HMO | Admitting: Family Medicine

## 2022-10-19 ENCOUNTER — Inpatient Hospital Stay: Payer: Medicare HMO | Attending: Hematology

## 2022-10-19 DIAGNOSIS — D696 Thrombocytopenia, unspecified: Secondary | ICD-10-CM | POA: Insufficient documentation

## 2022-10-19 DIAGNOSIS — D72821 Monocytosis (symptomatic): Secondary | ICD-10-CM

## 2022-10-19 LAB — CBC WITH DIFFERENTIAL/PLATELET
Abs Immature Granulocytes: 0 10*3/uL (ref 0.00–0.07)
Basophils Absolute: 0 10*3/uL (ref 0.0–0.1)
Basophils Relative: 0 %
Eosinophils Absolute: 0 10*3/uL (ref 0.0–0.5)
Eosinophils Relative: 0 %
HCT: 40.1 % (ref 36.0–46.0)
Hemoglobin: 12.8 g/dL (ref 12.0–15.0)
Lymphocytes Relative: 68 %
Lymphs Abs: 5.7 10*3/uL — ABNORMAL HIGH (ref 0.7–4.0)
MCH: 29.1 pg (ref 26.0–34.0)
MCHC: 31.9 g/dL (ref 30.0–36.0)
MCV: 91.1 fL (ref 80.0–100.0)
Monocytes Absolute: 0.8 10*3/uL (ref 0.1–1.0)
Monocytes Relative: 10 %
Neutro Abs: 1.8 10*3/uL (ref 1.7–7.7)
Neutrophils Relative %: 22 %
Platelets: 132 10*3/uL — ABNORMAL LOW (ref 150–400)
RBC: 4.4 MIL/uL (ref 3.87–5.11)
RDW: 13 % (ref 11.5–15.5)
WBC: 8.4 10*3/uL (ref 4.0–10.5)
nRBC: 0 % (ref 0.0–0.2)

## 2022-11-17 NOTE — Progress Notes (Unsigned)
[facility] [address], [place] [address]   CLINIC:  Medical Oncology/Hematology  PCP:  Tommie Sams, DO [address] [place] [address] [phone]   REASON FOR VISIT:  Follow-up for thrombocytopenia  PRIOR THERAPY: None  CURRENT THERAPY: Surveillance  INTERVAL HISTORY:   [place] 77 y.o. female returns for routine follow-up of thrombocytopenia.  She was last seen by Rojelio Brenner PA-C on 08/19/2022.  At today's visit, she reports feeling well.  No recent hospitalizations, surgeries, or changes in baseline health status.  She admits to easy bruising, but denies any petechial rash or bleeding events (such as melena, hematochezia, hematuria, gum bleeding, or nosebleeds).  She denies any infections within the past 3 months.   She has not had any B symptoms such as fever, chills, night sweats, unintentional weight loss.   She denies any new masses or lymphadenopathy.  She has 100% energy and 100% appetite. She endorses that she is maintaining a stable weight.  ASSESSMENT & PLAN:  1.  Mild thrombocytopenia, monocytosis and lymphocytosis - Platelets normal through March 2023.  Onset of mild thrombocytopenia first noted in April 2024.  (Platelets 109 on 06/12/2022, platelets 101 on 07/17/2022). - She reports symptoms of sinus infection for few weeks prior to 07/24/2022 when she was prescribed Augmentin for 7 days. - Pantoprazole was discontinued and famotidine was started on 07/24/2022.  No other new medications.  She has been on Celexa for 13 to 14 years and Crestor for approximately 1 year and Synthroid for 2 years. - Denies taking any quinine/tonic water.  No history of autoimmune or connective tissue disorder.  No history of liver disease or blood transfusion. - She will drink 2 to 3 glasses of wine on 2 to 3 days of the week (most weekends) - Hematology workup (08/04/2022): Extremely elevated immature platelet fraction 25.9%  Normal iron, B12,  MMA, copper, folate. Negative rheumatoid factor, ANA.  Normal ESR, normal CRP Negative hepatitis B, hepatitis C Immunofixation and SPEP.  Mildly elevated kappa free light chain at 29.1, normal lambda 22.6, normal FLC ratio 1.29. 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  "TET2 variants are highly associated with CMML and support diagnosis of CMML in correct clinicopathologic setting, especially if there is concurrent SRSF2 mutation.  TET2 mutations are also frequently seen in AML, MPN, and MDS as well as age-related clonal hematopoiesis." - CBC trend over the past 3 months has shown persistent mild thrombocytopenia, with intermittent elevations in monocytes and lymphocytes as well as intermittent neutropenia.  CBC/D today (11/18/2022) with platelets 112, neutrophils 1.3, monocytes 1.7.  Normal WBC, lymphocytes, hemoglobin.  - She reports mild easy bruising on lower extremities.   - No B symptoms/constitutional symptoms - Physical exam negative for lymphadenopathy or palpable splenomegaly. - DIFFERENTIAL DIAGNOSIS: Discussed NGS results with Dr. Kirtland Bouchard on 10/07/2022 -TET2 mutation in the setting of monocytosis, neutropenia, and thrombocytopenia was suspicious for CMML.  However, monocytosis, lymphocytosis, neutropenia have been intermittent.   Differential diagnosis includes other potential bone marrow disorder such as CLL or potential MDS/MPN hybrid. - PLAN: Discussed with patient that she likely has some underlying bone marrow disorder "brewing in the background," and could consider bone marrow biopsy versus watchful waiting.  We would certainly recommend bone marrow biopsy if platelets <100 or development of other cytopenias.  Patient opts to continue watchful waiting at this time. - CBC in 2 months (will add flow cytometry  if lymphocytes elevated) - RTC 4 months with labs the week before (immature platelet fraction, CBC/D, CMP, LDH) - Recheck  immature platelet fraction, CBC/D, CMP, and LDH at next visit - We will check US abdomen/pelvis to evaluate for any splenomegaly, liver disease,   2.   Social/family history: - She lives by herself at home and is independent of ADLs and IADLs.  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  Quit smoking in 1997.  She smoked 1 pack/day for 30 years.  No direct chemical exposure. - No family history of thrombocytopenia or malignancies.  PLAN SUMMARY: >> US abdomen >> Labs only in 2 months = CBC/D (plus hold tube for possible flow cytometry if lymphocytes are elevated) >> Labs in 4 months = immature platelet fraction, CBC/D, CMP, LDH >> OFFICE visit in 4 months (1 week after labs)     REVIEW OF SYSTEMS:   Review of Systems  Constitutional:  Negative for appetite change, chills, diaphoresis, fatigue, fever and unexpected weight change.  HENT:   Positive for sore throat (sinus drainage). Negative for lump/mass and nosebleeds.   Eyes:  Negative for eye problems.  Respiratory:  Negative for cough, hemoptysis and shortness of breath.   Cardiovascular:  Negative for chest pain, leg swelling and palpitations.  Gastrointestinal:  Negative for abdominal pain, blood in stool, constipation, diarrhea, nausea and vomiting.  Genitourinary:  Negative for hematuria.   Skin: Negative.   Neurological:  Positive for dizziness (vertigo). Negative for headaches and light-headedness.  Hematological:  Does not bruise/bleed easily.     PHYSICAL EXAM:  ECOG PERFORMANCE STATUS: 0 - Asymptomatic  Vitals:   11/18/22 1352  BP: (!) 149/46  Pulse: 75  Resp: 16  Temp: 97.7 F (36.5 C)  SpO2: 97%   Filed Weights   11/18/22 1346  Weight: 201 lb (91.2 kg)   Physical Exam Constitutional:      Appearance: Normal appearance. She is obese.  Cardiovascular:     Heart sounds: Normal heart sounds.  Pulmonary:     Breath sounds: Normal breath sounds.  Neurological:      General: No focal deficit present.     Mental Status: Mental status is at baseline.  Psychiatric:        Behavior: Behavior normal. Behavior is cooperative.     PAST MEDICAL/SURGICAL HISTORY:  Past Medical History:  Diagnosis Date   Back pain    CTS (carpal tunnel syndrome)    right   GERD (gastroesophageal reflux disease)    Past Surgical History:  Procedure Laterality Date   CATARACT EXTRACTION BILATERAL W/ ANTERIOR VITRECTOMY Bilateral    03/27/01 right 05/15/21 left    SOCIAL HISTORY:  Social History   Socioeconomic History   Marital status: Single    Spouse name: Not on file   Number of children: Not on file   Years of education: Not on file   Highest education level: 12th grade  Occupational History   Not on file  Tobacco Use   Smoking status: Former    Current packs/day: 0.00    Types: Cigarettes    Quit date: 02/24/1995    Years since quitting: 27.7   Smokeless tobacco: Former    Quit date: 01/11/1996  Vaping Use   Vaping status: Never Used  Substance and Sexual Activity   Alcohol use: Yes    Alcohol/week: 1.0 standard drink of alcohol    Types: 1 Glasses of wine per  week    Comment: occasional   Drug use: No   Sexual activity: Not on file  Other Topics Concern   Not on file  Social History Narrative   Not on file   Social Determinants of Health   Financial Resource Strain: Low Risk  (06/22/2022)   Overall Financial Resource Strain (CARDIA)    Difficulty of Paying Living Expenses: Not very hard  Food Insecurity: No Food Insecurity (06/22/2022)   Hunger Vital Sign    Worried About Running Out of Food in the Last Year: Never true    Ran Out of Food in the Last Year: Never true  Transportation Needs: No Transportation Needs (06/22/2022)   PRAPARE - Administrator, Civil Service (Medical): No    Lack of Transportation (Non-Medical): No  Physical Activity: Sufficiently Active (06/22/2022)   Exercise Vital Sign    Days of Exercise per Week: 3  days    Minutes of Exercise per Session: 60 min  Stress: Stress Concern Present (06/22/2022)   [employer] of Occupational Health - Occupational Stress Questionnaire    Feeling of Stress : To some extent  Social Connections: Unknown (06/22/2022)   Social Connection and Isolation Panel [NHANES]    Frequency of Communication with Friends and Family: Not on file    Frequency of Social Gatherings with Friends and Family: Twice a week    Attends Religious Services: Patient declined    Database administrator or Organizations: No    Attends Engineer, structural: Not on file    Marital Status: Divorced  Catering manager Violence: Not on file    FAMILY HISTORY:  Family History  Problem Relation Age of Onset   Hypertension Mother    Diabetes Mother    Heart disease Mother     CURRENT MEDICATIONS:  Outpatient Encounter Medications as of 11/18/2022  Medication Sig   citalopram (CELEXA) 10 MG tablet Take 1 tablet (10 mg total) by mouth daily.   diclofenac (VOLTAREN) 75 MG EC tablet Take 1 tablet (75 mg total) by mouth 2 (two) times daily as needed for moderate pain.   famotidine (PEPCID) 20 MG tablet Take 1 tablet (20 mg total) by mouth 2 (two) times daily.   levothyroxine (SYNTHROID) 50 MCG tablet TAKE 1 TABLET BY MOUTH EVERY DAY   meclizine (ANTIVERT) 25 MG tablet Take 1 tablet (25 mg total) by mouth 3 (three) times daily as needed for dizziness.   Propylene Glycol (SYSTANE BALANCE OP) Apply 1 drop to eye in the morning, at noon, and at bedtime. Dry eye   rosuvastatin (CRESTOR) 10 MG tablet Take 1 tablet (10 mg total) by mouth daily.   No facility-administered encounter medications on file as of 11/18/2022.    ALLERGIES:  No Known Allergies  LABORATORY DATA:  I have reviewed the labs as listed.  CBC    Component Value Date/Time   WBC 6.6 11/18/2022 1251   RBC 4.44 11/18/2022 1251   HGB 12.9 11/18/2022 1251   HGB 13.4 07/17/2022 1048   HCT 40.4 11/18/2022 1251   HCT  40.1 07/17/2022 1048   PLT 112 (L) 11/18/2022 1251   PLT 101 (L) 07/17/2022 1048   MCV 91.0 11/18/2022 1251   MCV 89 07/17/2022 1048   MCH 29.1 11/18/2022 1251   MCHC 31.9 11/18/2022 1251   RDW 13.5 11/18/2022 1251   RDW 12.1 07/17/2022 1048   LYMPHSABS 3.5 11/18/2022 1251   LYMPHSABS 3.2 (H) 07/17/2022 1048   MONOABS 1.7 (  H) 11/18/2022 1251   EOSABS 0.0 11/18/2022 1251   EOSABS 0.1 07/17/2022 1048   BASOSABS 0.1 11/18/2022 1251   BASOSABS 0.0 07/17/2022 1048      Latest Ref Rng & Units 06/12/2022   10:09 AM 11/05/2021    9:23 AM 04/24/2021   10:40 AM  CMP  Glucose 70 - 99 mg/dL 119  147  829   BUN 8 - 27 mg/dL 20  11  18    Creatinine 0.57 - 1.00 mg/dL 5.62  1.30  8.65   Sodium 134 - 144 mmol/L 136  139  137   Potassium 3.5 - 5.2 mmol/L 4.6  4.7  4.6   Chloride 96 - 106 mmol/L 101  103  102   CO2 20 - 29 mmol/L 22  23  22    Calcium 8.7 - 10.3 mg/dL 9.6  9.4  9.5   Total Protein 6.0 - 8.5 g/dL 7.8  7.7  7.9   Total Bilirubin 0.0 - 1.2 mg/dL 0.9  0.8  0.8   Alkaline Phos 44 - 121 IU/L 60  63  58   AST 0 - 40 IU/L 19  22  20    ALT 0 - 32 IU/L 18  21  21      DIAGNOSTIC IMAGING:  I have independently reviewed the relevant imaging and discussed with the patient.   WRAP UP:  All questions were answered. The patient knows to call the clinic with any problems, questions or concerns.  Medical decision making: Moderate  Time spent on visit: I spent 20 minutes counseling the patient face to face. The total time spent in the appointment was 30 minutes and more than 50% was on counseling.  Carnella Guadalajara, PA-C  11/18/22 5:05 PM

## 2022-11-18 ENCOUNTER — Inpatient Hospital Stay: Payer: Medicare HMO | Attending: Hematology | Admitting: Physician Assistant

## 2022-11-18 ENCOUNTER — Inpatient Hospital Stay: Payer: Medicare HMO

## 2022-11-18 ENCOUNTER — Encounter: Payer: Self-pay | Admitting: Physician Assistant

## 2022-11-18 VITALS — BP 149/46 | HR 75 | Temp 97.7°F | Resp 16 | Wt 201.0 lb

## 2022-11-18 DIAGNOSIS — D72821 Monocytosis (symptomatic): Secondary | ICD-10-CM | POA: Diagnosis not present

## 2022-11-18 DIAGNOSIS — D696 Thrombocytopenia, unspecified: Secondary | ICD-10-CM | POA: Diagnosis not present

## 2022-11-18 DIAGNOSIS — Z87891 Personal history of nicotine dependence: Secondary | ICD-10-CM | POA: Diagnosis not present

## 2022-11-18 LAB — CBC WITH DIFFERENTIAL/PLATELET
Abs Immature Granulocytes: 0.03 10*3/uL (ref 0.00–0.07)
Basophils Absolute: 0.1 10*3/uL (ref 0.0–0.1)
Basophils Relative: 1 %
Eosinophils Absolute: 0 10*3/uL (ref 0.0–0.5)
Eosinophils Relative: 0 %
HCT: 40.4 % (ref 36.0–46.0)
Hemoglobin: 12.9 g/dL (ref 12.0–15.0)
Immature Granulocytes: 1 %
Lymphocytes Relative: 52 %
Lymphs Abs: 3.5 10*3/uL (ref 0.7–4.0)
MCH: 29.1 pg (ref 26.0–34.0)
MCHC: 31.9 g/dL (ref 30.0–36.0)
MCV: 91 fL (ref 80.0–100.0)
Monocytes Absolute: 1.7 10*3/uL — ABNORMAL HIGH (ref 0.1–1.0)
Monocytes Relative: 26 %
Neutro Abs: 1.3 10*3/uL — ABNORMAL LOW (ref 1.7–7.7)
Neutrophils Relative %: 20 %
Platelets: 112 10*3/uL — ABNORMAL LOW (ref 150–400)
RBC: 4.44 MIL/uL (ref 3.87–5.11)
RDW: 13.5 % (ref 11.5–15.5)
WBC: 6.6 10*3/uL (ref 4.0–10.5)
nRBC: 0 % (ref 0.0–0.2)

## 2022-11-18 NOTE — Patient Instructions (Signed)
[facility] at [facility] **VISIT SUMMARY & IMPORTANT INSTRUCTIONS **   You were seen today by Rojelio Brenner PA-C for your low platelets.    THROMBOCYTOPENIA (low platelets): Your labs showed a genetic mutation called TET2, which is associated with certain bone marrow disorders (such as CMML and MDS).  Your blood work continues to show mildly low platelets and elevations in some of your white blood cells, but these abnormalities are not severe.  It is possible that you may have an underlying bone marrow disorder.  However, since your labs are only mildly abnormal, we can continue to "watch and wait" for the time being.  If your labs get worse, we would recommend a bone marrow biopsy to help [place] find out exactly what is going on.  FOLLOW-UP APPOINTMENTS:  >> Labs only in 2 months >> Labs and office visit in 4 months  ** Thank you for trusting me with your healthcare!  I strive to provide all of my patients with quality care at each visit.  If you receive a survey for this visit, I would be so grateful to you for taking the time to provide feedback.  Thank you in advance!  ~ [place]                   Dr. Doreatha Massed   &   Rojelio Brenner, PA-C   - - - - - - - - - - - - - - - - - -    Thank you for choosing [facility] at [facility] to provide your oncology and hematology care.  To afford each patient quality time with our provider, please arrive at least 15 minutes before your scheduled appointment time.   If you have a lab appointment with the Cancer Center please come in thru the Main Entrance and check in at the main information desk.  You need to re-schedule your appointment should you arrive 10 or more minutes late.  We strive to give you quality time with our providers, and arriving late affects you and other patients whose appointments are after yours.  Also, if you no show three or more times for appointments you may be  dismissed from the clinic at the providers discretion.     Again, thank you for choosing [facility].  Our hope is that these requests will decrease the amount of time that you wait before being seen by our physicians.       _____________________________________________________________  Should you have questions after your visit to [facility], please contact our office at [phone] and follow the prompts.  Our office hours are 8:00 a.m. and 4:30 p.m. Monday - Friday.  Please note that voicemails left after 4:00 p.m. may not be returned until the following business day.  We are closed weekends and major holidays.  You do have access to a nurse 24-7, just call the main number to the clinic [phone] and do not press any options, hold on the line and a nurse will answer the phone.    For prescription refill requests, have your pharmacy contact our office and allow 72 hours.

## 2022-11-26 ENCOUNTER — Ambulatory Visit (HOSPITAL_COMMUNITY)
Admission: RE | Admit: 2022-11-26 | Discharge: 2022-11-26 | Disposition: A | Discharge status: Home / Self Care | Payer: Medicare HMO | Source: Ambulatory Visit | Attending: Physician Assistant | Admitting: Physician Assistant

## 2022-11-26 DIAGNOSIS — D696 Thrombocytopenia, unspecified: Secondary | ICD-10-CM | POA: Insufficient documentation

## 2022-11-26 DIAGNOSIS — K760 Fatty (change of) liver, not elsewhere classified: Secondary | ICD-10-CM | POA: Diagnosis not present

## 2022-12-16 ENCOUNTER — Other Ambulatory Visit: Payer: Self-pay | Admitting: Physician Assistant

## 2022-12-16 ENCOUNTER — Encounter: Payer: Self-pay | Admitting: Physician Assistant

## 2022-12-16 DIAGNOSIS — D696 Thrombocytopenia, unspecified: Secondary | ICD-10-CM

## 2022-12-16 DIAGNOSIS — K760 Fatty (change of) liver, not elsewhere classified: Secondary | ICD-10-CM

## 2022-12-16 NOTE — Progress Notes (Signed)
Referral to gastroenterology due to findings of possible liver cirrhosis on recent ultrasound.  Patient has been notified.

## 2022-12-17 ENCOUNTER — Encounter (INDEPENDENT_AMBULATORY_CARE_PROVIDER_SITE_OTHER): Payer: Self-pay | Admitting: *Deleted

## 2022-12-17 ENCOUNTER — Other Ambulatory Visit: Payer: Self-pay | Admitting: Family Medicine

## 2022-12-22 ENCOUNTER — Telehealth: Payer: Self-pay | Admitting: Family Medicine

## 2022-12-22 NOTE — Telephone Encounter (Signed)
Patient wanting to know if she needs labs before 11/4 visit.please advise

## 2022-12-23 ENCOUNTER — Other Ambulatory Visit: Payer: Self-pay

## 2022-12-23 ENCOUNTER — Encounter (INDEPENDENT_AMBULATORY_CARE_PROVIDER_SITE_OTHER): Payer: Self-pay | Admitting: Gastroenterology

## 2022-12-23 ENCOUNTER — Ambulatory Visit (INDEPENDENT_AMBULATORY_CARE_PROVIDER_SITE_OTHER): Payer: Medicare HMO | Admitting: Gastroenterology

## 2022-12-23 VITALS — BP 144/80 | HR 71 | Temp 97.9°F | Ht 64.0 in | Wt 201.1 lb

## 2022-12-23 DIAGNOSIS — R030 Elevated blood-pressure reading, without diagnosis of hypertension: Secondary | ICD-10-CM

## 2022-12-23 DIAGNOSIS — D696 Thrombocytopenia, unspecified: Secondary | ICD-10-CM | POA: Diagnosis not present

## 2022-12-23 DIAGNOSIS — K7460 Unspecified cirrhosis of liver: Secondary | ICD-10-CM

## 2022-12-23 DIAGNOSIS — K766 Portal hypertension: Secondary | ICD-10-CM | POA: Diagnosis not present

## 2022-12-23 DIAGNOSIS — K760 Fatty (change of) liver, not elsewhere classified: Secondary | ICD-10-CM

## 2022-12-23 DIAGNOSIS — Z1211 Encounter for screening for malignant neoplasm of colon: Secondary | ICD-10-CM

## 2022-12-23 DIAGNOSIS — Z87891 Personal history of nicotine dependence: Secondary | ICD-10-CM | POA: Diagnosis not present

## 2022-12-23 DIAGNOSIS — E119 Type 2 diabetes mellitus without complications: Secondary | ICD-10-CM

## 2022-12-23 DIAGNOSIS — I10 Essential (primary) hypertension: Secondary | ICD-10-CM

## 2022-12-23 DIAGNOSIS — E785 Hyperlipidemia, unspecified: Secondary | ICD-10-CM

## 2022-12-23 DIAGNOSIS — Z6834 Body mass index (BMI) 34.0-34.9, adult: Secondary | ICD-10-CM | POA: Diagnosis not present

## 2022-12-23 NOTE — Telephone Encounter (Signed)
Called pt to inform labs have been ordered, no answer

## 2022-12-23 NOTE — Patient Instructions (Signed)
It was very nice to meet you today, as dicussed with will plan for the following :  1) lab work and ultrasound  2)  - Can take Tylenol max of 2 g per day (650 mg q8h) for pain - Avoid NSAIDs for pain - Avoid eating raw oysters/shellfish  3) call us back if you would like to pursue upper endoscopy and possibly colonoscopy

## 2022-12-23 NOTE — Progress Notes (Signed)
Vista Lawman , M.D. Gastroenterology & Hepatology [facility] [address] [place], [place] [address] Primary Care Physician: Tommie Sams, DO [address] [place] [address]  Chief Complaint:  Thrombocytopenia , possible cirrhosis   History of Present Illness: Morgan KORPI is a 77 y.o. female with HLD ,hypothyroidism  GERD who presents for evaluation of Thrombocytopenia and  possible cirrhosis   Patient had ultrasound of the liver done given chronic thrombocytopenia which demonstrated possible cirrhotic liver but no splenomegaly  Patient reports that she was never a heavy drinker but had alcohol occasionally over the weekend ; wine but no hard liquor.  Patient denies any generalized itching or yellowing of the skin.  Denies using any herbal medication.  Does not have any family history of liver issues.  Never been diagnosed with hepatitis B or C. The patient denies having any nausea, vomiting, fever, chills, hematochezia, melena, hematemesis, abdominal distention, abdominal pain, diarrhea, jaundice, pruritus or weight loss.  Last UJW:JXBJ Last Colonoscopy:over 10 years ago   FHx: neg for any gastrointestinal/liver disease, no malignancies Social: neg smoking, occasional wine  Surgical: no abdominal surgeries  Past Medical History: Past Medical History:  Diagnosis Date   Back pain    CTS (carpal tunnel syndrome)    right   GERD (gastroesophageal reflux disease)     Past Surgical History: Past Surgical History:  Procedure Laterality Date   CATARACT EXTRACTION BILATERAL W/ ANTERIOR VITRECTOMY Bilateral    03/27/01 right 05/15/21 left    Family History: Family History  Problem Relation Age of Onset   Hypertension Mother    Diabetes Mother    Heart disease Mother     Social History: Social History   Tobacco Use  Smoking Status Former   Current packs/day: 0.00   Types: Cigarettes   Quit date: 02/24/1995    Years since quitting: 27.8  Smokeless Tobacco Former   Quit date: 01/11/1996   Social History   Substance and Sexual Activity  Alcohol Use Yes   Alcohol/week: 1.0 standard drink of alcohol   Types: 1 Glasses of wine per week   Comment: occasional   Social History   Substance and Sexual Activity  Drug Use No    Allergies: No Known Allergies  Medications: Current Outpatient Medications  Medication Sig Dispense Refill   citalopram (CELEXA) 10 MG tablet Take 1 tablet (10 mg total) by mouth daily. 90 tablet 1   diclofenac (VOLTAREN) 75 MG EC tablet Take 1 tablet (75 mg total) by mouth 2 (two) times daily as needed for moderate pain. 30 tablet 1   famotidine (PEPCID) 20 MG tablet Take 1 tablet (20 mg total) by mouth 2 (two) times daily. 180 tablet 1   levothyroxine (SYNTHROID) 50 MCG tablet TAKE 1 TABLET BY MOUTH EVERY DAY 90 tablet 1   meclizine (ANTIVERT) 25 MG tablet Take 1 tablet (25 mg total) by mouth 3 (three) times daily as needed for dizziness. 30 tablet 1   Propylene Glycol (SYSTANE BALANCE OP) Apply 1 drop to eye in the morning, at noon, and at bedtime. Dry eye     rosuvastatin (CRESTOR) 10 MG tablet TAKE 1 TABLET BY MOUTH EVERY DAY 90 tablet 1   No current facility-administered medications for this visit.    Review of Systems: GENERAL: negative for malaise, night sweats HEENT: No changes in hearing or vision, no nose bleeds or other nasal problems. NECK: Negative for lumps, goiter, pain and significant neck  swelling RESPIRATORY: Negative for cough, wheezing CARDIOVASCULAR: Negative for chest pain, leg swelling, palpitations, orthopnea GI: SEE HPI MUSCULOSKELETAL: Negative for joint pain or swelling, back pain, and muscle pain. SKIN: Negative for lesions, rash HEMATOLOGY Negative for prolonged bleeding, bruising easily, and swollen nodes. ENDOCRINE: Negative for cold or heat intolerance, polyuria, polydipsia and goiter. NEURO: negative for tremor, gait imbalance,  syncope and seizures. The remainder of the review of systems is noncontributory.   Physical Exam: BP (!) 144/80   Pulse 71   Temp 97.9 F (36.6 C) (Oral)   Ht 5\' 4"  (1.626 m)   Wt 201 lb 1.6 oz (91.2 kg)   BMI 34.52 kg/m  GENERAL: The patient is AO x3, in no acute distress. HEENT: Head is normocephalic and atraumatic. EOMI are intact. Mouth is well hydrated and without lesions. NECK: Supple. No masses LUNGS: Clear to auscultation. No presence of rhonchi/wheezing/rales. Adequate chest expansion HEART: RRR, normal s1 and s2. ABDOMEN: Soft, nontender, no guarding, no peritoneal signs, and nondistended. BS +. No masses.   Imaging/Labs: as above     Latest Ref Rng & Units 11/18/2022   12:51 PM 10/19/2022    2:18 PM 09/17/2022    2:05 PM  CBC  WBC 4.0 - 10.5 K/uL 6.6  8.4  8.5   Hemoglobin 12.0 - 15.0 g/dL 82.9  56.2  13.0   Hematocrit 36.0 - 46.0 % 40.4  40.1  40.0   Platelets 150 - 400 K/uL 112  132  129    Lab Results  Component Value Date   IRON 92 08/04/2022   TIBC 429 08/04/2022   FERRITIN 70 08/04/2022    I personally reviewed and interpreted the available labs, imaging and endoscopic files.  11/2022 IMPRESSION: 1. Hepatic steatosis. Borderline nodular margins could reflect underlying cirrhosis. Recommend correlation with LFTs. 2. Spleen is normal in size.  Impression and Plan:  Morgan Hill is a 77 y.o. female with HLD ,hypothyroidism  GERD who presents for evaluation of Thrombocytopenia and  possible cirrhosis   #Concerns for Compensated Cirrhosis   Can't calculate FIB 4 or MELD score given lack of lab values   Platelets 112 ( Platelet <150) which indicates portal hypertension   .  Although ultrasound did not show splenomegaly hence uncertain if thrombocytopenia is related to splenic sequestration  On exam patient does not have signs of advanced chronic liver disease, no splenomegaly, ascites, spider angiomas, palmar eythema   Will evaluate for CSPH  (clinically significant portal hypertension) with Fibroscan/elastrography   #Eitology :   If patient deemed to have cirrhosis after elastography this may be due to MASLD with risk factors of BMI : 34 and HLD/HTN  Negative ANA Hepatitis B nonimmune nonexposed hep C negative in September 2024 Normal IgG and IgM levels Ferritin 70  Will obtain baseline ASMA, AMA, INR, hepatitis A vaccination status  # Hepatic encephalopathy None  # Ascites None  # Esophageal varices Patient advised for baseline screening upper endoscopy for varices if found to have CSPH and also would benefit from NSBB  in future . She will think about it .   # HCC screening If patient found to have CSPH or advance fibrosis than would need life long HCC Screening with AFP+ U/S q 6 months  - Can take Tylenol max of 2 g per day (650 mg q8h) for pain - Avoid NSAIDs for pain - Avoid eating raw oysters/shellfish   #BMI  34   Recommendation :     -  walking at a brisk pace/biking at moderate intesity 2.5-5 hours per week     - use pedometer/step counter to track activity     - goal to lose 5-10% of initial body weight     - avoid suagry drinks and juices, use zero calorie beverages     - increase water intake     - eat a low carb diet with plenty of veggies and fruit     - Get sufficient sleep 7-8 hrs nightly     - maitain active lifestyle     - avoid alcohol     - recommend 2-3 cups Coffee daily     - Counsel on lowering cholesterol by having a diet rich in vegetables,          protein (avoid red meats) and good fats(fish, salmon).  #Hypertension The patient was found to have elevated blood pressure when vital signs were checked in the office. The blood pressure was rechecked by the nursing staff and it was found be persistently elevated >140/90 mmHg. I personally advised to the patient to follow up closely with PCP for hypertension control.   #HCM  Last colon cancer screening was over 10 years ago.   As  per [place]  Multi-Society Task Force on Colorectal Cancer recommendation; For individuals ages 33 to 46, the decision to start or continue screening should be individualized.  Discussed with patient screening colonoscopy.  She will think about it  All questions were answered.      Vista Lawman, MD Gastroenterology and Hepatology [facility]   This chart has been completed using [facility] Dictation software, and while attempts have been made to ensure accuracy , certain words and phrases may not be transcribed as intended

## 2022-12-24 DIAGNOSIS — E785 Hyperlipidemia, unspecified: Secondary | ICD-10-CM | POA: Diagnosis not present

## 2022-12-24 DIAGNOSIS — R030 Elevated blood-pressure reading, without diagnosis of hypertension: Secondary | ICD-10-CM | POA: Diagnosis not present

## 2022-12-24 DIAGNOSIS — E119 Type 2 diabetes mellitus without complications: Secondary | ICD-10-CM | POA: Diagnosis not present

## 2022-12-25 LAB — LIPID PANEL
Chol/HDL Ratio: 2.2 ratio (ref 0.0–4.4)
Cholesterol, Total: 151 mg/dL (ref 100–199)
HDL: 68 mg/dL (ref >39)
LDL Chol Calc (NIH): 63 mg/dL (ref 0–99)
Triglycerides: 115 mg/dL (ref 0–149)
VLDL Cholesterol Cal: 20 mg/dL (ref 5–40)

## 2022-12-25 LAB — BASIC METABOLIC PANEL
BUN/Creatinine Ratio: 16 (ref 12–28)
BUN: 13 mg/dL (ref 8–27)
CO2: 24 mmol/L (ref 20–29)
Calcium: 9.8 mg/dL (ref 8.7–10.3)
Chloride: 102 mmol/L (ref 96–106)
Creatinine, Ser: 0.81 mg/dL (ref 0.57–1.00)
Glucose: 144 mg/dL — ABNORMAL HIGH (ref 70–99)
Potassium: 4.5 mmol/L (ref 3.5–5.2)
Sodium: 140 mmol/L (ref 134–144)
eGFR: 75 mL/min/{1.73_m2} (ref >59)

## 2022-12-25 LAB — MICROALBUMIN / CREATININE URINE RATIO
Creatinine, Urine: 281.3 mg/dL
Microalb/Creat Ratio: 15 mg/g{creat} (ref 0–29)
Microalbumin, Urine: 42.2 ug/mL

## 2022-12-25 LAB — HEMOGLOBIN A1C
Est. average glucose Bld gHb Est-mCnc: 154 mg/dL
Hgb A1c MFr Bld: 7 % — ABNORMAL HIGH (ref 4.8–5.6)

## 2022-12-26 LAB — NASH FIBROSURE(R) PLUS
ALPHA 2-MACROGLOBULINS, QN: 173 mg/dL (ref 110–276)
ALT (SGPT) P5P: 24 IU/L (ref 0–40)
AST (SGOT) P5P: 21 [IU]/L (ref 0–40)
Apolipoprotein A-1: 191 mg/dL (ref 116–209)
Bilirubin, Total: 0.7 mg/dL (ref 0.0–1.2)
Cholesterol, Total: 157 mg/dL (ref 100–199)
Fibrosis Score: 0.25 — ABNORMAL HIGH (ref 0.00–0.21)
GGT: 26 [IU]/L (ref 0–60)
Glucose: 141 mg/dL — ABNORMAL HIGH (ref 70–99)
Haptoglobin: 74 mg/dL (ref 42–346)
NASH Score: 0.65 — ABNORMAL HIGH (ref 0.00–0.25)
Steatosis Score: 0.62 — ABNORMAL HIGH (ref 0.00–0.40)
Triglycerides: 134 mg/dL (ref 0–149)

## 2022-12-26 LAB — MITOCHONDRIAL ANTIBODIES: Mitochondrial Ab: 30.8 U — ABNORMAL HIGH (ref 0.0–20.0)

## 2022-12-26 LAB — ANTI-SMOOTH MUSCLE ANTIBODY, IGG: Smooth Muscle Ab: 10 U (ref 0–19)

## 2022-12-26 LAB — PROTIME-INR
INR: 1.1 (ref 0.9–1.2)
Prothrombin Time: 12.3 s — ABNORMAL HIGH (ref 9.1–12.0)

## 2022-12-26 LAB — HIV ANTIBODY (ROUTINE TESTING W REFLEX): HIV Screen 4th Generation wRfx: NONREACTIVE

## 2022-12-26 LAB — HEPATITIS A ANTIBODY, TOTAL: hep A Total Ab: NEGATIVE

## 2022-12-28 ENCOUNTER — Ambulatory Visit (INDEPENDENT_AMBULATORY_CARE_PROVIDER_SITE_OTHER): Payer: Medicare HMO | Admitting: Family Medicine

## 2022-12-28 VITALS — BP 162/79 | HR 63 | Ht 64.0 in | Wt 203.0 lb

## 2022-12-28 DIAGNOSIS — Z23 Encounter for immunization: Secondary | ICD-10-CM

## 2022-12-28 DIAGNOSIS — M19049 Primary osteoarthritis, unspecified hand: Secondary | ICD-10-CM

## 2022-12-28 DIAGNOSIS — K760 Fatty (change of) liver, not elsewhere classified: Secondary | ICD-10-CM

## 2022-12-28 DIAGNOSIS — E119 Type 2 diabetes mellitus without complications: Secondary | ICD-10-CM | POA: Diagnosis not present

## 2022-12-28 DIAGNOSIS — M1990 Unspecified osteoarthritis, unspecified site: Secondary | ICD-10-CM | POA: Insufficient documentation

## 2022-12-28 DIAGNOSIS — I10 Essential (primary) hypertension: Secondary | ICD-10-CM

## 2022-12-28 MED ORDER — LOSARTAN POTASSIUM 50 MG PO TABS: 50.0000 mg | ORAL_TABLET | Freq: Every day | ORAL | 3 refills | Status: DC

## 2022-12-28 NOTE — Progress Notes (Signed)
Subjective:  Patient ID: Morgan Hill, female    DOB: 06/15/1945  Age: 77 y.o. MRN: [id number]  CC:   Chief Complaint  Patient presents with   Diabetes    A1c    knots on fingers     Occ pain     HPI:  77 year old female presents for follow-up.  A1c has risen to 7.  She is not on any pharmacotherapy at this time.  She has recently been evaluated by GI for concerns for cirrhosis.  Has ultrasound scheduled.  BP elevated at GI office recently.  Elevated here as well.  No pharmacotherapy at this time.  Need to start from therapy given uncontrolled hypertension. Lipids have been at goal on Crestor.  Patient reports that she has some "knots" on some of her fingers.  This is likely osteoarthritis.  Will evaluate today.  Patient wants her flu shot today.  Patient Active Problem List   Diagnosis Date Noted   Osteoarthritis 12/28/2022   Cirrhosis of liver without ascites (HCC) 12/23/2022   Essential hypertension 12/23/2022   Metabolic dysfunction-associated steatotic liver disease (MASLD) 12/23/2022   Thrombocytopenia (HCC) 06/25/2022   Preventative health care 11/05/2021   Hyperlipidemia 04/24/2021   Hypothyroidism 08/19/2020   Depression 07/14/2014   Type 2 diabetes mellitus without complications (HCC) 11/02/2013   GERD (gastroesophageal reflux disease) 05/03/2013   Obesity 05/02/2013    Social Hx   Social History   Socioeconomic History   Marital status: Single    Spouse name: Not on file   Number of children: Not on file   Years of education: Not on file   Highest education level: 12th grade  Occupational History   Not on file  Tobacco Use   Smoking status: Former    Current packs/day: 0.00    Types: Cigarettes    Quit date: 02/24/1995    Years since quitting: 27.8   Smokeless tobacco: Former    Quit date: 01/11/1996  Vaping Use   Vaping status: Never Used  Substance and Sexual Activity   Alcohol use: Yes    Alcohol/week: 1.0 standard drink of alcohol     Types: 1 Glasses of wine per week    Comment: occasional   Drug use: No   Sexual activity: Not on file  Other Topics Concern   Not on file  Social History Narrative   Not on file   Social Determinants of Health   Financial Resource Strain: Low Risk  (06/22/2022)   Overall Financial Resource Strain (CARDIA)    Difficulty of Paying Living Expenses: Not very hard  Food Insecurity: No Food Insecurity (06/22/2022)   Hunger Vital Sign    Worried About Running Out of Food in the Last Year: Never true    Ran Out of Food in the Last Year: Never true  Transportation Needs: No Transportation Needs (12/25/2022)   PRAPARE - Administrator, Civil Service (Medical): No    Lack of Transportation (Non-Medical): No  Physical Activity: Sufficiently Active (06/22/2022)   Exercise Vital Sign    Days of Exercise per Week: 3 days    Minutes of Exercise per Session: 60 min  Stress: Stress Concern Present (06/22/2022)   [employer] of Occupational Health - Occupational Stress Questionnaire    Feeling of Stress : To some extent  Social Connections: Unknown (06/22/2022)   Social Connection and Isolation Panel [NHANES]    Frequency of Communication with Friends and Family: Not on file    Frequency of  Social Gatherings with Friends and Family: Twice a week    Attends Religious Services: Patient declined    Database administrator or Organizations: No    Attends Engineer, structural: Not on file    Marital Status: Divorced    Review of Systems Per HPI  Objective:  BP (!) 162/79   Pulse 63   Ht 5\' 4"  (1.626 m)   Wt 203 lb (92.1 kg)   SpO2 99%   BMI 34.84 kg/m      12/28/2022    2:42 PM 12/28/2022    2:22 PM 12/23/2022    2:21 PM  BP/Weight  Systolic BP 162 163 144  Diastolic BP 79 83 80  Wt. (Lbs)  203   BMI  34.84 kg/m2     Physical Exam Constitutional:      Appearance: Normal appearance. She is obese.  HENT:     Head: Normocephalic and atraumatic.   Cardiovascular:     Rate and Rhythm: Normal rate and regular rhythm.  Pulmonary:     Effort: Pulmonary effort is normal.     Breath sounds: Normal breath sounds. No wheezing, rhonchi or rales.  Musculoskeletal:     Comments: Left fifth digit with mild swelling at the DIP joint.  Neurological:     Mental Status: She is alert.  Psychiatric:        Mood and Affect: Mood normal.        Behavior: Behavior normal.     Lab Results  Component Value Date   WBC 6.6 11/18/2022   HGB 12.9 11/18/2022   HCT 40.4 11/18/2022   PLT 112 (L) 11/18/2022   GLUCOSE 144 (H) 12/24/2022   CHOL 157 12/24/2022   TRIG 134 12/24/2022   HDL 68 12/24/2022   LDLCALC 63 12/24/2022   ALT 18 06/12/2022   AST 19 06/12/2022   NA 140 12/24/2022   K 4.5 12/24/2022   CL 102 12/24/2022   CREATININE 0.81 12/24/2022   BUN 13 12/24/2022   CO2 24 12/24/2022   TSH 2.280 06/12/2022   INR 1.1 12/24/2022   HGBA1C 7.0 (H) 12/24/2022     Assessment & Plan:   Problem List Items Addressed This Visit       Cardiovascular and Mediastinum   Essential hypertension    Uncontrolled.  Starting losartan.  BMP in 2 weeks.      Relevant Medications   losartan (COZAAR) 50 MG tablet   Other Relevant Orders   Basic Metabolic Panel     Digestive   Metabolic dysfunction-associated steatotic liver disease (MASLD)    Would benefit from weight loss and GLP-1 medication.  Discussed this today.  Patient will consider.        Endocrine   Type 2 diabetes mellitus without complications (HCC) - Primary    A1c has risen to 7.  Recommended GLP-1 medication.  Patient will consider.      Relevant Medications   losartan (COZAAR) 50 MG tablet     Musculoskeletal and Integument   Osteoarthritis    Diclofenac as needed.  Supportive care.      Other Visit Diagnoses     Immunization due       Relevant Orders   Flu Vaccine Trivalent High Dose (Fluad) (Completed)       Meds ordered this encounter  Medications    losartan (COZAAR) 50 MG tablet    Sig: Take 1 tablet (50 mg total) by mouth daily.    Dispense:  90  tablet    Refill:  3    Follow-up:  Return in about 1 month (around 01/27/2023) for HTN follow up.  Everlene Other DO [facility]

## 2022-12-28 NOTE — Assessment & Plan Note (Signed)
Uncontrolled.  Starting losartan.  BMP in 2 weeks.

## 2022-12-28 NOTE — Progress Notes (Signed)
Hi Toniann Fail ,  Can you please call the patient and tell the patient the lab work did not show cirrhosis, this is a good news.  It shows your liver is working well.  Although one of the lab result AMA is positive which we can discuss in future  At this time awaiting elastography ultrasound .  Please continue to see [place] in the clinic in next few months  Thanks,  Vista Lawman, MD Gastroenterology and Hepatology [facility] Gastroenterology  ==============  Normal INR Antimitochondrial antibody(AMA) positive at 30.8  [employer] with F2 0 F1 fibrosis moderate to severe steatosis. This did not show advance fibrosis   Hepatitis A nonimmune HIV negative ASMA negative  AMA is a very specific test but without ALP elevation the diagnostic yield is uncertain for PBC  In future may obtain total IgG and IgM and Liver biopsy

## 2022-12-28 NOTE — Assessment & Plan Note (Signed)
A1c has risen to 7.  Recommended GLP-1 medication.  Patient will consider.

## 2022-12-28 NOTE — Patient Instructions (Signed)
Start losartan.  Lab in 2 weeks.  Follow up in 1 month for your BP.  Consider Ozempic.

## 2022-12-28 NOTE — Assessment & Plan Note (Addendum)
Diclofenac as needed.  Supportive care.

## 2022-12-28 NOTE — Assessment & Plan Note (Signed)
Would benefit from weight loss and GLP-1 medication.  Discussed this today.  Patient will consider.

## 2022-12-29 ENCOUNTER — Encounter (INDEPENDENT_AMBULATORY_CARE_PROVIDER_SITE_OTHER): Payer: Self-pay | Admitting: *Deleted

## 2022-12-29 ENCOUNTER — Ambulatory Visit (HOSPITAL_COMMUNITY)
Admission: RE | Admit: 2022-12-29 | Discharge: 2022-12-29 | Disposition: A | Discharge status: Home / Self Care | Payer: Medicare HMO | Source: Ambulatory Visit | Attending: Gastroenterology | Admitting: Gastroenterology

## 2022-12-29 DIAGNOSIS — Z944 Liver transplant status: Secondary | ICD-10-CM | POA: Diagnosis not present

## 2022-12-29 DIAGNOSIS — K7460 Unspecified cirrhosis of liver: Secondary | ICD-10-CM | POA: Diagnosis not present

## 2022-12-29 DIAGNOSIS — R932 Abnormal findings on diagnostic imaging of liver and biliary tract: Secondary | ICD-10-CM | POA: Diagnosis not present

## 2022-12-29 DIAGNOSIS — K760 Fatty (change of) liver, not elsewhere classified: Secondary | ICD-10-CM | POA: Diagnosis not present

## 2022-12-30 NOTE — Progress Notes (Signed)
Hi Morgan Hill ,  Can you please call the patient and tell the patient the ultrasound did not show any evidence of advance fibrosis , but shows fatty liver . Hence given both Blood work ( NASH Fibrosure) and Ultrasound did NOT show advance fibrosis , she likely does NOT have Cirrhosis  . This is a good !  Thanks,  Vista Lawman, MD Gastroenterology and Hepatology [facility] Gastroenterology

## 2023-01-11 DIAGNOSIS — I10 Essential (primary) hypertension: Secondary | ICD-10-CM | POA: Diagnosis not present

## 2023-01-12 LAB — BASIC METABOLIC PANEL
BUN/Creatinine Ratio: 24 (ref 12–28)
BUN: 24 mg/dL (ref 8–27)
CO2: 17 mmol/L — ABNORMAL LOW (ref 20–29)
Calcium: 9.6 mg/dL (ref 8.7–10.3)
Chloride: 101 mmol/L (ref 96–106)
Creatinine, Ser: 1 mg/dL (ref 0.57–1.00)
Glucose: 105 mg/dL — ABNORMAL HIGH (ref 70–99)
Potassium: 4.7 mmol/L (ref 3.5–5.2)
Sodium: 138 mmol/L (ref 134–144)
eGFR: 58 mL/min/{1.73_m2} — ABNORMAL LOW (ref >59)

## 2023-01-14 ENCOUNTER — Other Ambulatory Visit: Payer: Self-pay | Admitting: Family Medicine

## 2023-01-27 ENCOUNTER — Other Ambulatory Visit: Payer: Self-pay | Admitting: Physician Assistant

## 2023-01-27 ENCOUNTER — Inpatient Hospital Stay: Payer: Medicare HMO | Attending: Hematology

## 2023-01-27 ENCOUNTER — Ambulatory Visit (INDEPENDENT_AMBULATORY_CARE_PROVIDER_SITE_OTHER): Payer: Medicare HMO | Admitting: Family Medicine

## 2023-01-27 VITALS — BP 150/74 | HR 82 | Wt 202.0 lb

## 2023-01-27 DIAGNOSIS — D72820 Lymphocytosis (symptomatic): Secondary | ICD-10-CM | POA: Insufficient documentation

## 2023-01-27 DIAGNOSIS — K760 Fatty (change of) liver, not elsewhere classified: Secondary | ICD-10-CM

## 2023-01-27 DIAGNOSIS — D72821 Monocytosis (symptomatic): Secondary | ICD-10-CM | POA: Insufficient documentation

## 2023-01-27 DIAGNOSIS — E1169 Type 2 diabetes mellitus with other specified complication: Secondary | ICD-10-CM

## 2023-01-27 DIAGNOSIS — I10 Essential (primary) hypertension: Secondary | ICD-10-CM

## 2023-01-27 DIAGNOSIS — E119 Type 2 diabetes mellitus without complications: Secondary | ICD-10-CM

## 2023-01-27 DIAGNOSIS — D696 Thrombocytopenia, unspecified: Secondary | ICD-10-CM | POA: Insufficient documentation

## 2023-01-27 LAB — CBC WITH DIFFERENTIAL/PLATELET
Abs Immature Granulocytes: 0 10*3/uL (ref 0.00–0.07)
Band Neutrophils: 1 %
Basophils Absolute: 0 10*3/uL (ref 0.0–0.1)
Basophils Relative: 0 %
Eosinophils Absolute: 0.1 10*3/uL (ref 0.0–0.5)
Eosinophils Relative: 1 %
HCT: 40.5 % (ref 36.0–46.0)
Hemoglobin: 12.9 g/dL (ref 12.0–15.0)
Lymphocytes Relative: 73 %
Lymphs Abs: 6 10*3/uL — ABNORMAL HIGH (ref 0.7–4.0)
MCH: 29.4 pg (ref 26.0–34.0)
MCHC: 31.9 g/dL (ref 30.0–36.0)
MCV: 92.3 fL (ref 80.0–100.0)
Monocytes Absolute: 0.7 10*3/uL (ref 0.1–1.0)
Monocytes Relative: 8 %
Neutro Abs: 1.5 10*3/uL — ABNORMAL LOW (ref 1.7–7.7)
Neutrophils Relative %: 17 %
Platelets: 110 10*3/uL — ABNORMAL LOW (ref 150–400)
RBC: 4.39 MIL/uL (ref 3.87–5.11)
RDW: 13.4 % (ref 11.5–15.5)
WBC Morphology: >10
WBC: 8.2 10*3/uL (ref 4.0–10.5)
nRBC: 0 % (ref 0.0–0.2)

## 2023-01-27 NOTE — Assessment & Plan Note (Signed)
Needs weight loss. Patient wants to just proceed with lifestyle changes.

## 2023-01-27 NOTE — Assessment & Plan Note (Signed)
Had a lengthy discussion about starting medication, particularly GLP-1 medication which would benefit Obesity, Hepatic steatosis, and DM-2. She wants to just proceed with lifestyle changes. Will revisit at next visit.

## 2023-01-27 NOTE — Progress Notes (Signed)
Subjective:  Patient ID: Morgan Hill, female    DOB: 15-Sep-1945  Age: 77 y.o. MRN: [id number]  CC:   Chief Complaint  Patient presents with   Hypertension    Follow up , no concerns     HPI:  77 year old female presents for follow up.  Recently seen by GI. No evidence of cirrhosis. Has known Hepatic steatosis. Will discuss GLP-1 medications today.   BP elevated here today. She endorses compliance with Losartan.   Lipids at goal on Crestor.   Diabetes is relatively well controlled. Most recent A1c 7.0.  No meds currently. Would benefit from GLP-1 medication.   Patient Active Problem List   Diagnosis Date Noted   Osteoarthritis 12/28/2022   Essential hypertension 12/23/2022   Metabolic dysfunction-associated steatotic liver disease (MASLD) 12/23/2022   Thrombocytopenia (HCC) 06/25/2022   Preventative health care 11/05/2021   Hyperlipidemia 04/24/2021   Hypothyroidism 08/19/2020   Depression 07/14/2014   Type 2 diabetes mellitus without complications (HCC) 11/02/2013   GERD (gastroesophageal reflux disease) 05/03/2013   Obesity 05/02/2013    Social Hx   Social History   Socioeconomic History   Marital status: Single    Spouse name: Not on file   Number of children: Not on file   Years of education: Not on file   Highest education level: 12th grade  Occupational History   Not on file  Tobacco Use   Smoking status: Former    Current packs/day: 0.00    Types: Cigarettes    Quit date: 02/24/1995    Years since quitting: 27.9   Smokeless tobacco: Former    Quit date: 01/11/1996  Vaping Use   Vaping status: Never Used  Substance and Sexual Activity   Alcohol use: Yes    Alcohol/week: 1.0 standard drink of alcohol    Types: 1 Glasses of wine per week    Comment: occasional   Drug use: No   Sexual activity: Not on file  Other Topics Concern   Not on file  Social History Narrative   Not on file   Social Determinants of Health   Financial Resource Strain:  Low Risk  (06/22/2022)   Overall Financial Resource Strain (CARDIA)    Difficulty of Paying Living Expenses: Not very hard  Food Insecurity: No Food Insecurity (06/22/2022)   Hunger Vital Sign    Worried About [place] Out of Food in the Last Year: Never true    Ran Out of Food in the Last Year: Never true  Transportation Needs: No Transportation Needs (12/25/2022)   PRAPARE - Administrator, Civil Service (Medical): No    Lack of Transportation (Non-Medical): No  Physical Activity: Sufficiently Active (06/22/2022)   Exercise Vital Sign    Days of Exercise per Week: 3 days    Minutes of Exercise per Session: 60 min  Stress: Stress Concern Present (06/22/2022)   [employer] of Occupational Health - Occupational Stress Questionnaire    Feeling of Stress : To some extent  Social Connections: Unknown (06/22/2022)   Social Connection and Isolation Panel [NHANES]    Frequency of Communication with Friends and Family: Not on file    Frequency of Social Gatherings with Friends and Family: Twice a week    Attends Religious Services: Patient declined    Database administrator or Organizations: No    Attends Engineer, structural: Not on file    Marital Status: Divorced    Review of Systems  Respiratory:  Negative.    Cardiovascular: Negative.    Objective:  BP (!) 150/74   Pulse 82   Wt 202 lb (91.6 kg)   SpO2 98%   BMI 34.67 kg/m      01/27/2023    9:42 AM 01/27/2023    9:21 AM 12/28/2022    2:42 PM  BP/Weight  Systolic BP 150 141 162  Diastolic BP 74 59 79  Wt. (Lbs)  202   BMI  34.67 kg/m2     Physical Exam Constitutional:      Appearance: Normal appearance. She is obese.  HENT:     Head: Normocephalic and atraumatic.  Cardiovascular:     Rate and Rhythm: Normal rate and regular rhythm.  Pulmonary:     Effort: Pulmonary effort is normal.     Breath sounds: Normal breath sounds. No wheezing, rhonchi or rales.  Neurological:     Mental Status: She  is alert.  Psychiatric:        Mood and Affect: Mood normal.        Behavior: Behavior normal.     Lab Results  Component Value Date   WBC 8.2 01/27/2023   HGB 12.9 01/27/2023   HCT 40.5 01/27/2023   PLT 110 (L) 01/27/2023   GLUCOSE 105 (H) 01/11/2023   CHOL 157 12/24/2022   TRIG 134 12/24/2022   HDL 68 12/24/2022   LDLCALC 63 12/24/2022   ALT 18 06/12/2022   AST 19 06/12/2022   NA 138 01/11/2023   K 4.7 01/11/2023   CL 101 01/11/2023   CREATININE 1.00 01/11/2023   BUN 24 01/11/2023   CO2 17 (L) 01/11/2023   TSH 2.280 06/12/2022   INR 1.1 12/24/2022   HGBA1C 7.0 (H) 12/24/2022     Assessment & Plan:   Problem List Items Addressed This Visit       Cardiovascular and Mediastinum   Essential hypertension - Primary    BP mildly elevated today, but acceptable given age. Advised to monitor BP at home. Continue Losartan.         Digestive   Metabolic dysfunction-associated steatotic liver disease (MASLD)    Needs weight loss. Patient wants to just proceed with lifestyle changes.         Endocrine   Type 2 diabetes mellitus without complications (HCC)    Had a lengthy discussion about starting medication, particularly GLP-1 medication which would benefit Obesity, Hepatic steatosis, and DM-2. She wants to just proceed with lifestyle changes. Will revisit at next visit.        Follow-up:  Follow up in Feb.  Everlene Other DO [facility]

## 2023-01-27 NOTE — Patient Instructions (Signed)
Continue your medications.  Consider GLP-1 medication.  Follow up in February.  Take care  Dr. Adriana Simas

## 2023-01-27 NOTE — Assessment & Plan Note (Signed)
BP mildly elevated today, but acceptable given age. Advised to monitor BP at home. Continue Losartan.

## 2023-01-29 LAB — SURGICAL PATHOLOGY

## 2023-02-01 LAB — FLOW CYTOMETRY

## 2023-02-09 ENCOUNTER — Other Ambulatory Visit: Payer: Self-pay | Admitting: Family Medicine

## 2023-02-09 DIAGNOSIS — F321 Major depressive disorder, single episode, moderate: Secondary | ICD-10-CM

## 2023-03-05 ENCOUNTER — Ambulatory Visit
Admission: EM | Admit: 2023-03-05 | Discharge: 2023-03-05 | Disposition: A | Discharge status: Home / Self Care | Payer: Medicare HMO | Attending: Family Medicine | Admitting: Family Medicine

## 2023-03-05 ENCOUNTER — Encounter: Payer: Self-pay | Admitting: Emergency Medicine

## 2023-03-05 DIAGNOSIS — J329 Chronic sinusitis, unspecified: Secondary | ICD-10-CM | POA: Diagnosis not present

## 2023-03-05 DIAGNOSIS — B9789 Other viral agents as the cause of diseases classified elsewhere: Secondary | ICD-10-CM | POA: Diagnosis not present

## 2023-03-05 HISTORY — DX: Essential (primary) hypertension: I10

## 2023-03-05 HISTORY — DX: Pure hypercholesterolemia, unspecified: E78.00

## 2023-03-05 MED ORDER — AZELASTINE HCL 0.1 % NA SOLN: 1.0000 | Freq: Two times a day (BID) | NASAL | 0 refills | Status: DC

## 2023-03-05 MED ORDER — PROMETHAZINE-DM 6.25-15 MG/5ML PO SYRP: 5.0000 mL | ORAL_SOLUTION | Freq: Four times a day (QID) | ORAL | 0 refills | Status: DC | PRN

## 2023-03-05 NOTE — Discharge Instructions (Signed)
 In addition to the prescribed medications, you may use Flonase nasal spray, Coricidin HBP, plain Mucinex, saline sinus rinses, humidifiers, warm honey tea.

## 2023-03-05 NOTE — ED Provider Notes (Signed)
 [facility]    CSN: [id number] Arrival date & time: 03/05/23  1116      History   Chief Complaint No chief complaint on file.   HPI Morgan Hill is a 78 y.o. female.   Patient presenting today with 4-day history of progressively worsening sinus pain and pressure, sinus drainage, scratchy throat, hoarseness, ear pressure.  Denies fever, chills, chest pain, shortness of breath, cough, abdominal pain, nausea vomiting or diarrhea.  So far trying Zicam with minimal relief.  No known history of chronic pulmonary disease.  No known sick contacts recently.    Past Medical History:  Diagnosis Date   Back pain    CTS (carpal tunnel syndrome)    right   GERD (gastroesophageal reflux disease)    High cholesterol    Hypertension     Patient Active Problem List   Diagnosis Date Noted   Osteoarthritis 12/28/2022   Essential hypertension 12/23/2022   Metabolic dysfunction-associated steatotic liver disease (MASLD) 12/23/2022   Thrombocytopenia (HCC) 06/25/2022   Preventative health care 11/05/2021   Hyperlipidemia 04/24/2021   Hypothyroidism 08/19/2020   Depression 07/14/2014   Type 2 diabetes mellitus without complications (HCC) 11/02/2013   GERD (gastroesophageal reflux disease) 05/03/2013   Obesity 05/02/2013    Past Surgical History:  Procedure Laterality Date   CATARACT EXTRACTION BILATERAL W/ ANTERIOR VITRECTOMY Bilateral    03/27/01 right 05/15/21 left    OB History   No obstetric history on file.      Home Medications    Prior to Admission medications   Medication Sig Start Date End Date Taking? Authorizing Provider  azelastine  (ASTELIN ) 0.1 % nasal spray Place 1 spray into both nostrils 2 (two) times daily. Use in each nostril as directed 03/05/23  Yes Stuart Vernell Norris, PA-C  promethazine -dextromethorphan (PROMETHAZINE -DM) 6.25-15 MG/5ML syrup Take 5 mLs by mouth 4 (four) times daily as needed. 03/05/23  Yes Stuart Vernell Norris, PA-C   citalopram  (CELEXA ) 10 MG tablet TAKE 1 TABLET BY MOUTH EVERY DAY 02/09/23   Cook, Jayce G, DO  diclofenac  (VOLTAREN ) 75 MG EC tablet Take 1 tablet (75 mg total) by mouth 2 (two) times daily as needed for moderate pain. 06/25/22   Cook, Jayce G, DO  famotidine  (PEPCID ) 20 MG tablet TAKE 1 TABLET BY MOUTH TWICE A DAY 01/14/23   Cook, Jayce G, DO  levothyroxine  (SYNTHROID ) 50 MCG tablet TAKE 1 TABLET BY MOUTH EVERY DAY 09/21/22   Cook, Jayce G, DO  losartan  (COZAAR ) 50 MG tablet Take 1 tablet (50 mg total) by mouth daily. 12/28/22   Cook, Jayce G, DO  meclizine  (ANTIVERT ) 25 MG tablet Take 1 tablet (25 mg total) by mouth 3 (three) times daily as needed for dizziness. 06/25/22   Cook, Jayce G, DO  Propylene Glycol (SYSTANE BALANCE OP) Apply 1 drop to eye in the morning, at noon, and at bedtime. Dry eye    [provider]  rosuvastatin  (CRESTOR ) 10 MG tablet TAKE 1 TABLET BY MOUTH EVERY DAY 12/17/22   Cook, Jayce G, DO    Family History Family History  Problem Relation Age of Onset   Hypertension Mother    Diabetes Mother    Heart disease Mother     Social History Social History   Tobacco Use   Smoking status: Former    Current packs/day: 0.00    Types: Cigarettes    Quit date: 02/24/1995    Years since quitting: 28.0   Smokeless tobacco: Former  Quit date: 01/11/1996  Vaping Use   Vaping status: Never Used  Substance Use Topics   Alcohol use: Yes    Alcohol/week: 1.0 standard drink of alcohol    Types: 1 Glasses of wine per week    Comment: occasional   Drug use: No     Allergies   Patient has no known allergies.   Review of Systems Review of Systems Per HPI  Physical Exam Triage Vital Signs ED Triage Vitals  Encounter Vitals Group     BP 03/05/23 1119 (!) 168/82     Systolic BP Percentile --      Diastolic BP Percentile --      Pulse Rate 03/05/23 1119 87     Resp 03/05/23 1119 18     Temp 03/05/23 1119 97.7 F (36.5 C)     Temp Source 03/05/23 1119 Oral      SpO2 03/05/23 1119 99 %     Weight --      Height --      Head Circumference --      Peak Flow --      Pain Score 03/05/23 1120 1     Pain Loc --      Pain Education --      Exclude from Growth Chart --    No data found.  Updated Vital Signs BP (!) 168/82 (BP Location: Right Arm)   Pulse 87   Temp 97.7 F (36.5 C) (Oral)   Resp 18   SpO2 99%   Visual Acuity Right Eye Distance:   Left Eye Distance:   Bilateral Distance:    Right Eye Near:   Left Eye Near:    Bilateral Near:     Physical Exam Vitals and nursing note reviewed.  Constitutional:      Appearance: Normal appearance.  HENT:     Head: Atraumatic.     Right Ear: Tympanic membrane and external ear normal.     Left Ear: Tympanic membrane and external ear normal.     Nose: Rhinorrhea present.     Mouth/Throat:     Mouth: Mucous membranes are moist.     Pharynx: Posterior oropharyngeal erythema present.  Eyes:     Extraocular Movements: Extraocular movements intact.     Conjunctiva/sclera: Conjunctivae normal.  Cardiovascular:     Rate and Rhythm: Normal rate and regular rhythm.     Heart sounds: Normal heart sounds.  Pulmonary:     Effort: Pulmonary effort is normal.     Breath sounds: Normal breath sounds. No wheezing or rales.  Musculoskeletal:        General: Normal range of motion.     Cervical back: Normal range of motion and neck supple.  Skin:    General: Skin is warm and dry.  Neurological:     Mental Status: She is alert and oriented to person, place, and time.  Psychiatric:        Mood and Affect: Mood normal.        Thought Content: Thought content normal.      UC Treatments / Results  Labs (all labs ordered are listed, but only abnormal results are displayed) Labs Reviewed - No data to display  EKG   Radiology No results found.  Procedures Procedures (including critical care time)  Medications Ordered in UC Medications - No data to display  Initial Impression /  Assessment and Plan / UC Course  I have reviewed the triage vital signs and the nursing notes.  Pertinent labs & imaging results that were available during my care of the patient were reviewed by me and considered in my medical decision making (see chart for details).     Consistent with viral sinusitis.  Discussed duration and expected course as well as prescribed Astelin  nasal spray, Phenergan  DM, and discussed safe supportive over-the-counter medications and home care.  Return for any worsening symptoms.  Final Clinical Impressions(s) / UC Diagnoses   Final diagnoses:  Viral sinusitis     Discharge Instructions      In addition to the prescribed medications, you may use Flonase  nasal spray, Coricidin HBP, plain Mucinex, saline sinus rinses, humidifiers, warm honey tea.    ED Prescriptions     Medication Sig Dispense Auth. Provider   azelastine  (ASTELIN ) 0.1 % nasal spray Place 1 spray into both nostrils 2 (two) times daily. Use in each nostril as directed 30 mL Stuart Vernell Norris, PA-C   promethazine -dextromethorphan (PROMETHAZINE -DM) 6.25-15 MG/5ML syrup Take 5 mLs by mouth 4 (four) times daily as needed. 100 mL Stuart Vernell Norris, [place]      PDMP not reviewed this encounter.   Stuart Vernell Norris, [place] 03/05/23 1210

## 2023-03-05 NOTE — ED Triage Notes (Signed)
 Nasal congestion since Monday.  Sinus pressure and right ear feels full.  States throat feels scratchy this morning.  Has been taking zicam.

## 2023-03-17 ENCOUNTER — Inpatient Hospital Stay: Payer: Medicare HMO | Attending: Hematology

## 2023-03-17 DIAGNOSIS — D72820 Lymphocytosis (symptomatic): Secondary | ICD-10-CM | POA: Diagnosis not present

## 2023-03-17 DIAGNOSIS — D72821 Monocytosis (symptomatic): Secondary | ICD-10-CM | POA: Diagnosis present

## 2023-03-17 DIAGNOSIS — D696 Thrombocytopenia, unspecified: Secondary | ICD-10-CM | POA: Insufficient documentation

## 2023-03-17 DIAGNOSIS — Z87891 Personal history of nicotine dependence: Secondary | ICD-10-CM | POA: Insufficient documentation

## 2023-03-17 DIAGNOSIS — Z79899 Other long term (current) drug therapy: Secondary | ICD-10-CM | POA: Diagnosis not present

## 2023-03-17 LAB — IMMATURE PLATELET FRACTION: Immature Platelet Fraction: 26.8 % — ABNORMAL HIGH (ref 1.2–8.6)

## 2023-03-17 LAB — CBC WITH DIFFERENTIAL/PLATELET
Abs Immature Granulocytes: 0.06 10*3/uL (ref 0.00–0.07)
Basophils Absolute: 0 10*3/uL (ref 0.0–0.1)
Basophils Relative: 1 %
Eosinophils Absolute: 0 10*3/uL (ref 0.0–0.5)
Eosinophils Relative: 0 %
HCT: 38.7 % (ref 36.0–46.0)
Hemoglobin: 12.4 g/dL (ref 12.0–15.0)
Immature Granulocytes: 1 %
Lymphocytes Relative: 52 %
Lymphs Abs: 4 10*3/uL (ref 0.7–4.0)
MCH: 29.7 pg (ref 26.0–34.0)
MCHC: 32 g/dL (ref 30.0–36.0)
MCV: 92.6 fL (ref 80.0–100.0)
Monocytes Absolute: 1.9 10*3/uL — ABNORMAL HIGH (ref 0.1–1.0)
Monocytes Relative: 24 %
Neutro Abs: 1.7 10*3/uL (ref 1.7–7.7)
Neutrophils Relative %: 22 %
Platelets: 126 10*3/uL — ABNORMAL LOW (ref 150–400)
RBC: 4.18 MIL/uL (ref 3.87–5.11)
RDW: 13.2 % (ref 11.5–15.5)
WBC: 7.7 10*3/uL (ref 4.0–10.5)
nRBC: 0 % (ref 0.0–0.2)

## 2023-03-17 LAB — COMPREHENSIVE METABOLIC PANEL
ALT: 19 U/L (ref 0–44)
AST: 18 U/L (ref 15–41)
Albumin: 4 g/dL (ref 3.5–5.0)
Alkaline Phosphatase: 50 U/L (ref 38–126)
Anion gap: 10 (ref 5–15)
BUN: 18 mg/dL (ref 8–23)
CO2: 24 mmol/L (ref 22–32)
Calcium: 8.9 mg/dL (ref 8.9–10.3)
Chloride: 106 mmol/L (ref 98–111)
Creatinine, Ser: 0.76 mg/dL (ref 0.44–1.00)
GFR, Estimated: >60 mL/min (ref >60)
Glucose, Bld: 133 mg/dL — ABNORMAL HIGH (ref 70–99)
Potassium: 4.5 mmol/L (ref 3.5–5.1)
Sodium: 140 mmol/L (ref 135–145)
Total Bilirubin: 0.5 mg/dL (ref 0.0–1.2)
Total Protein: 7.2 g/dL (ref 6.5–8.1)

## 2023-03-17 LAB — LACTATE DEHYDROGENASE: LDH: 178 U/L (ref 98–192)

## 2023-03-22 NOTE — Progress Notes (Unsigned)
[facility] [address], [place] [address]   CLINIC:  Medical Oncology/Hematology  PCP:  Tommie Sams, DO [address] [place] [address] [phone]     REASON FOR VISIT:  Follow-up for thrombocytopenia  PRIOR THERAPY: None  CURRENT THERAPY: Surveillance  INTERVAL HISTORY:   Morgan Hill 78 y.o. female returns for routine follow-up of thrombocytopenia.  She was last seen by Rojelio Brenner PA-C on 11/18/2022.  US abdomen from 11/26/2022 showed normal-sized spleen, but hepatic steatosis with borderline nodular margins that could reflect underlying cirrhosis.  Patient was referred to GI, and liver elastography showed no evidence of advanced fibrosis, which along with blood work obtained by GI is reassuring that patient does not have liver cirrhosis.  She does have fatty liver.   At today's visit, she reports feeling fairly well.  No recent hospitalizations, surgeries, or changes in baseline health status.  She reports "month-long head cold" with symptoms of sinus pressure, earaches, and congestion; no fever.  Eventually recovered without antibiotics.  She denies any infections within the past 4 months that have required antibiotic treatment.  She admits to easy bruising, but denies any petechial rash or bleeding events (such as melena, hematochezia, hematuria, gum bleeding, or nosebleeds).  She has not had any B symptoms such as fever, chills, night sweats, unintentional weight loss.  She denies any new masses or lymphadenopathy.  She has 75% energy and 100% appetite. She endorses that she is maintaining a stable weight.  ASSESSMENT & PLAN:  1.  Mild thrombocytopenia, monocytosis and lymphocytosis - Platelets normal through March 2023.  Onset of mild thrombocytopenia first noted in April 2024.  (Platelets 109 on 06/12/2022, platelets 101 on 07/17/2022). - No new medications prior to onset of thrombocytopenia - Denies taking any quinine/tonic water.  No  history of autoimmune or connective tissue disorder.  No history of blood transfusions. - US abdomen from 11/26/2022 showed normal-sized spleen, but hepatic steatosis with borderline nodular margins that could reflect underlying cirrhosis.  Patient was referred to GI, and liver elastography showed no evidence of advanced fibrosis, which along with blood work obtained by GI is reassuring that patient does not have liver cirrhosis.  She does have fatty liver.  - She will drink 2 to 3 glasses of wine on 2 to 3 days of the week (most weekends) - Hematology workup (08/04/2022): Significantly elevated immature platelet fraction 25.9%  Normal iron, B12, MMA, copper, folate. Negative rheumatoid factor, ANA.  Normal ESR, normal CRP Negative hepatitis B, hepatitis C Immunofixation and SPEP.  Mildly elevated kappa free light chain at 29.1, normal lambda 22.6, normal FLC ratio 1.29. 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  "TET2 variants are highly associated with CMML and support diagnosis of CMML in correct clinicopathologic setting, especially if there is concurrent SRSF2 mutation.  TET2 mutations are also frequently seen in AML, MPN, and MDS as well as age-related clonal hematopoiesis." - Flow cytometry (01/27/2023): T cells with relative abundance of CD8 positive cells.  Lymphoid population shows predominance of T cells (95% of lymphocytes) expressing pan T-cell antigens but with reversal of the CD4: CD8 ratio.  A fraction of T cells (40%) shows slightly dimmer expression for CD5 with relative abundance of CD8 positivity.  No CD34 expression identified.  The T-cell changes may indicate a neoplastic process, particularly of large granular lymphocytes.  Gene rearrangement studies may be helpful in this regard.  No  B-cell population or significant CD34 positive blastic population identified.  Relatively abundant monocytic population (20%) shows typical  phenotypic features. - CBC trend over the past 3 months has shown persistent mild thrombocytopenia, with intermittent elevations in monocytes and lymphocytes as well as intermittent neutropenia. - Most recent CBC (03/17/2023): Platelets 126.  WBC 7.7 with ANC 1.7, ALC 4.0, monocytes 1.9. Normal CMP and LDH. Immature platelet fraction elevated at 26.8%  - She reports mild easy bruising on lower extremities.   - No B symptoms/constitutional symptoms - No recent infections or antibiotics - Physical exam negative for lymphadenopathy or palpable splenomegaly. - DIFFERENTIAL DIAGNOSIS: Discussed NGS results with Dr. 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   Per discussion with Dr. Ellin Saba on 03/22/2023: Flow cytometry is suggestive but not diagnostic for LGL leukemia.  Additional studies required. Differential diagnosis includes other potential bone marrow disorder such as CLL or potential MDS/MPN hybrid.  Thrombocytopenia may represent ITP in the setting of bone marrow malignancy. - PLAN: Discussed with patient that she likely has some underlying bone marrow disorder "brewing in the background," and could consider bone marrow biopsy versus watchful waiting.  We would certainly recommend bone marrow biopsy if platelets <100 or development of other cytopenias.  Patient opts to continue watchful waiting at this time. - Check T-cell gene rearrangement studies for evaluation of possible LGL leukemia - Recommend MD visit in 1 month to discuss results of T-cell gene rearrangement and discuss diagnosis  2.   Social/family history: - She lives by herself at home and is independent of ADLs and IADLs.  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  Quit smoking in 1997.  She smoked 1 pack/day for 30 years.  No direct chemical exposure. - No family history of thrombocytopenia or  malignancies.  PLAN SUMMARY: >> Labs TODAY = T-cell gene rearrangement studies ([facility] [id number]) + platelet antibody profile >> MD visit in 1 month  (can return to APP list after MD visit)     REVIEW OF SYSTEMS:   Review of Systems  Constitutional:  Negative for appetite change, chills, diaphoresis, fatigue, fever and unexpected weight change.  HENT:   Negative for lump/mass, nosebleeds and sore throat (sinus drainage).   Eyes:  Negative for eye problems.  Respiratory:  Negative for cough, hemoptysis and shortness of breath.   Cardiovascular:  Negative for chest pain, leg swelling and palpitations.  Gastrointestinal:  Negative for abdominal pain, blood in stool, constipation, diarrhea, nausea and vomiting.  Genitourinary:  Negative for hematuria.   Skin: Negative.   Neurological:  Positive for dizziness (vertigo). Negative for headaches and light-headedness.  Hematological:  Does not bruise/bleed easily.     PHYSICAL EXAM:  ECOG PERFORMANCE STATUS: 0 - Asymptomatic  Vitals:   03/23/23 0942  BP: (!) 167/75  Pulse: 73  Resp: 16  Temp: 98.5 F (36.9 C)  SpO2: 99%    Filed Weights   03/23/23 0942  Weight: 204 lb 12.9 oz (92.9 kg)    Physical Exam Constitutional:      Appearance: Normal appearance. She is obese.  Cardiovascular:     Heart sounds: Normal heart sounds.  Pulmonary:     Breath sounds: Normal breath sounds.  Neurological:     General: No focal deficit present.     Mental Status: Mental status is at baseline.  Psychiatric:  Behavior: Behavior normal. Behavior is cooperative.     PAST MEDICAL/SURGICAL HISTORY:  Past Medical History:  Diagnosis Date   Back pain    CTS (carpal tunnel syndrome)    right   GERD (gastroesophageal reflux disease)    High cholesterol    Hypertension    Past Surgical History:  Procedure Laterality Date   CATARACT EXTRACTION BILATERAL W/ ANTERIOR VITRECTOMY Bilateral    03/27/01 right 05/15/21 left    SOCIAL  HISTORY:  Social History   Socioeconomic History   Marital status: Single    Spouse name: Not on file   Number of children: Not on file   Years of education: Not on file   Highest education level: 12th grade  Occupational History   Not on file  Tobacco Use   Smoking status: Former    Current packs/day: 0.00    Types: Cigarettes    Quit date: 02/24/1995    Years since quitting: 28.0   Smokeless tobacco: Former    Quit date: 01/11/1996  Vaping Use   Vaping status: Never Used  Substance and Sexual Activity   Alcohol use: Yes    Alcohol/week: 1.0 standard drink of alcohol    Types: 1 Glasses of wine per week    Comment: occasional   Drug use: No   Sexual activity: Not on file  Other Topics Concern   Not on file  Social History Narrative   Not on file   Social Drivers of Health   Financial Resource Strain: Low Risk  (06/22/2022)   Overall Financial Resource Strain (CARDIA)    Difficulty of Paying Living Expenses: Not very hard  Food Insecurity: No Food Insecurity (06/22/2022)   Hunger Vital Sign    Worried About Running Out of Food in the Last Year: Never true    Ran Out of Food in the Last Year: Never true  Transportation Needs: No Transportation Needs (12/25/2022)   PRAPARE - Administrator, Civil Service (Medical): No    Lack of Transportation (Non-Medical): No  Physical Activity: Sufficiently Active (06/22/2022)   Exercise Vital Sign    Days of Exercise per Week: 3 days    Minutes of Exercise per Session: 60 min  Stress: Stress Concern Present (06/22/2022)   [place]    Feeling of Stress : To some extent  Social Connections: Unknown (06/22/2022)   Social Connection and Isolation Panel [NHANES]    Frequency of Communication with Friends and Family: Not on file    Frequency of Social Gatherings with Friends and Family: Twice a week    Attends Religious Services: Patient declined    Automotive engineer or Organizations: No    Attends Engineer, structural: Not on file    Marital Status: Divorced  Catering manager Violence: Not on file    FAMILY HISTORY:  Family History  Problem Relation Age of Onset   Hypertension Mother    Diabetes Mother    Heart disease Mother     CURRENT MEDICATIONS:  Outpatient Encounter Medications as of 03/23/2023  Medication Sig   citalopram (CELEXA) 10 MG tablet TAKE 1 TABLET BY MOUTH EVERY DAY   diclofenac (VOLTAREN) 75 MG EC tablet Take 1 tablet (75 mg total) by mouth 2 (two) times daily as needed for moderate pain.   famotidine (PEPCID) 20 MG tablet TAKE 1 TABLET BY MOUTH TWICE A DAY   levothyroxine (SYNTHROID) 50 MCG tablet TAKE 1 TABLET  BY MOUTH EVERY DAY   losartan (COZAAR) 50 MG tablet Take 1 tablet (50 mg total) by mouth daily.   meclizine (ANTIVERT) 25 MG tablet Take 1 tablet (25 mg total) by mouth 3 (three) times daily as needed for dizziness.   Propylene Glycol (SYSTANE BALANCE OP) Apply 1 drop to eye in the morning, at noon, and at bedtime. Dry eye   rosuvastatin (CRESTOR) 10 MG tablet TAKE 1 TABLET BY MOUTH EVERY DAY   [DISCONTINUED] azelastine (ASTELIN) 0.1 % nasal spray Place 1 spray into both nostrils 2 (two) times daily. Use in each nostril as directed   [DISCONTINUED] promethazine-dextromethorphan (PROMETHAZINE-DM) 6.25-15 MG/5ML syrup Take 5 mLs by mouth 4 (four) times daily as needed.   No facility-administered encounter medications on file as of 03/23/2023.    ALLERGIES:  No Known Allergies  LABORATORY DATA:  I have reviewed the labs as listed.  CBC    Component Value Date/Time   WBC 7.7 03/17/2023 1316   RBC 4.18 03/17/2023 1316   HGB 12.4 03/17/2023 1316   HGB 13.4 07/17/2022 1048   HCT 38.7 03/17/2023 1316   HCT 40.1 07/17/2022 1048   PLT 126 (L) 03/17/2023 1316   PLT 101 (L) 07/17/2022 1048   MCV 92.6 03/17/2023 1316   MCV 89 07/17/2022 1048   MCH 29.7 03/17/2023 1316   MCHC 32.0 03/17/2023  1316   RDW 13.2 03/17/2023 1316   RDW 12.1 07/17/2022 1048   LYMPHSABS 4.0 03/17/2023 1316   LYMPHSABS 3.2 (H) 07/17/2022 1048   MONOABS 1.9 (H) 03/17/2023 1316   EOSABS 0.0 03/17/2023 1316   EOSABS 0.1 07/17/2022 1048   BASOSABS 0.0 03/17/2023 1316   BASOSABS 0.0 07/17/2022 1048      Latest Ref Rng & Units 03/17/2023    1:16 PM 01/11/2023    2:36 PM 12/24/2022   10:11 AM  CMP  Glucose 70 - 99 mg/dL 409  811  914   BUN 8 - 23 mg/dL 18  24  13    Creatinine 0.44 - 1.00 mg/dL 7.82  9.56  2.13   Sodium 135 - 145 mmol/L 140  138  140   Potassium 3.5 - 5.1 mmol/L 4.5  4.7  4.5   Chloride 98 - 111 mmol/L 106  101  102   CO2 22 - 32 mmol/L 24  17  24    Calcium 8.9 - 10.3 mg/dL 8.9  9.6  9.8   Total Protein 6.5 - 8.1 g/dL 7.2     Total Bilirubin 0.0 - 1.2 mg/dL 0.5     Alkaline Phos 38 - 126 U/L 50     AST 15 - 41 U/L 18     ALT 0 - 44 U/L 19       DIAGNOSTIC IMAGING:  I have independently reviewed the relevant imaging and discussed with the patient.   WRAP UP:  All questions were answered. The patient knows to call the clinic with any problems, questions or concerns.  Medical decision making: Moderate  Time spent on visit: I spent 20 minutes counseling the patient face to face. The total time spent in the appointment was 30 minutes and more than 50% was on counseling.  Carnella Guadalajara, PA-C  03/23/23 11:28 AM

## 2023-03-23 ENCOUNTER — Inpatient Hospital Stay (HOSPITAL_BASED_OUTPATIENT_CLINIC_OR_DEPARTMENT_OTHER): Payer: Medicare HMO | Admitting: Physician Assistant

## 2023-03-23 ENCOUNTER — Inpatient Hospital Stay: Payer: Medicare HMO

## 2023-03-23 VITALS — BP 167/75 | HR 73 | Temp 98.5°F | Resp 16 | Wt 204.8 lb

## 2023-03-23 DIAGNOSIS — D696 Thrombocytopenia, unspecified: Secondary | ICD-10-CM | POA: Diagnosis not present

## 2023-03-23 DIAGNOSIS — D72820 Lymphocytosis (symptomatic): Secondary | ICD-10-CM

## 2023-03-23 DIAGNOSIS — Z87891 Personal history of nicotine dependence: Secondary | ICD-10-CM | POA: Diagnosis not present

## 2023-03-23 NOTE — Patient Instructions (Addendum)
[facility] at [facility] **VISIT SUMMARY & IMPORTANT INSTRUCTIONS **   You were seen today by Rojelio Brenner PA-C for your low platelets.    THROMBOCYTOPENIA (low platelets):  It is likely that you may have an underlying bone marrow disorder (low-grade chronic blood cancer).   Your labs showed a genetic mutation called TET2, which is associated with certain bone marrow disorders (such as CMML and MDS).   Your labs also showed findings that suggest possible LGL (large grandular lymphocytic leukemia).   Your blood work continues to show mildly low platelets and elevations in some of your white blood cells, but these abnormalities are not severe.   We will check some additional blood work today that will help [place] narrow the diagnosis.    Otherwise - since your labs are only mildly abnormal and you are not having any worrisome symptoms, we can continue to "watch and wait" for the time being.   FOLLOW-UP APPOINTMENTS: Office visit with Dr. Ellin Saba in 1 month  ** Thank you for trusting me with your healthcare!  I strive to provide all of my patients with quality care at each visit.  If you receive a survey for this visit, I would be so grateful to you for taking the time to provide feedback.  Thank you in advance!  ~ Karysa Heft                   Dr. Doreatha Massed   &   Rojelio Brenner, PA-C   - - - - - - - - - - - - - - - - - -    Thank you for choosing [facility] at [facility] to provide your oncology and hematology care.  To afford each patient quality time with our provider, please arrive at least 15 minutes before your scheduled appointment time.   If you have a lab appointment with the Cancer Center please come in thru the Main Entrance and check in at the main information desk.  You need to re-schedule your appointment should you arrive 10 or more minutes late.  We strive to give you quality time with our providers, and arriving  late affects you and other patients whose appointments are after yours.  Also, if you no show three or more times for appointments you may be dismissed from the clinic at the providers discretion.     Again, thank you for choosing [facility].  Our hope is that these requests will decrease the amount of time that you wait before being seen by our physicians.       _____________________________________________________________  Should you have questions after your visit to [facility], please contact our office at [phone] and follow the prompts.  Our office hours are 8:00 a.m. and 4:30 p.m. Monday - Friday.  Please note that voicemails left after 4:00 p.m. may not be returned until the following business day.  We are closed weekends and major holidays.  You do have access to a nurse 24-7, just call the main number to the clinic [phone] and do not press any options, hold on the line and a nurse will answer the phone.    For prescription refill requests, have your pharmacy contact our office and allow 72 hours.

## 2023-03-26 LAB — PLATELET ANTIBODY PROFILE
Glycoprotein IV Antibody: NEGATIVE
HLA Ab Ser Ql EIA: NEGATIVE
IA/IIA Antibody: NEGATIVE
IB/IX Antibody: NEGATIVE
IIB/IIIA Antibody: NEGATIVE

## 2023-03-30 ENCOUNTER — Ambulatory Visit: Payer: Medicare HMO | Admitting: Family Medicine

## 2023-03-31 ENCOUNTER — Encounter: Payer: Self-pay | Admitting: Physician Assistant

## 2023-03-31 ENCOUNTER — Ambulatory Visit (INDEPENDENT_AMBULATORY_CARE_PROVIDER_SITE_OTHER): Payer: Medicare HMO | Admitting: Physician Assistant

## 2023-03-31 VITALS — BP 132/78 | HR 70 | Temp 97.3°F | Ht 64.0 in | Wt 203.0 lb

## 2023-03-31 DIAGNOSIS — I10 Essential (primary) hypertension: Secondary | ICD-10-CM | POA: Diagnosis not present

## 2023-03-31 DIAGNOSIS — E1169 Type 2 diabetes mellitus with other specified complication: Secondary | ICD-10-CM | POA: Diagnosis not present

## 2023-03-31 DIAGNOSIS — E119 Type 2 diabetes mellitus without complications: Secondary | ICD-10-CM

## 2023-03-31 NOTE — Progress Notes (Signed)
   Established Patient Office Visit  Subjective   Patient ID: Morgan Hill, female    DOB: Mar 09, 1945  Age: 78 y.o. MRN: [id number]  Chief Complaint  Patient presents with   Follow-up    Hypertension f/u     Patient presents today for follow-up on hypertension.  Patient reports daily compliance with medications and denies concerns for adverse effects.  Patient denies new onset headaches or visual changes.  Patient endorses efforts towards lifestyle changes to include walking 30 to 40 minutes 2-3 times per week and dietary changes to include less sweets and sugars and increased vegetables and fruits.  She states overall she is feeling well.  Patient with [facility] reminder for A1c due at the end of March.  Patient with no other concerns or complaints today.     Review of Systems  Constitutional:  Negative for chills, fever and weight loss.  Eyes:  Negative for blurred vision and double vision.  Respiratory:  Negative for cough and shortness of breath.   Cardiovascular:  Negative for chest pain and palpitations.  Gastrointestinal:  Negative for nausea and vomiting.  Neurological:  Negative for headaches.      Objective:     BP 132/78   Pulse 70   Temp (!) 97.3 F (36.3 C)   Ht 5' 4 (1.626 m)   Wt 203 lb (92.1 kg)   SpO2 98%   BMI 34.84 kg/m    Physical Exam Constitutional:      Appearance: Normal appearance. She is obese.  HENT:     Head: Normocephalic.     Mouth/Throat:     Mouth: Mucous membranes are moist.     Pharynx: Oropharynx is clear.  Eyes:     Extraocular Movements: Extraocular movements intact.     Conjunctiva/sclera: Conjunctivae normal.  Cardiovascular:     Rate and Rhythm: Normal rate and regular rhythm.     Heart sounds: No murmur heard.    No gallop.  Pulmonary:     Effort: Pulmonary effort is normal.     Breath sounds: No wheezing, rhonchi or rales.  Musculoskeletal:     Right lower leg: No edema.     Left lower leg: No edema.  Skin:     General: Skin is warm and dry.  Neurological:     General: No focal deficit present.     Mental Status: She is alert and oriented to person, place, and time.  Psychiatric:        Mood and Affect: Mood normal.        Behavior: Behavior normal.      No results found for any visits on 03/31/23.  The 10-year ASCVD risk score (Arnett DK, et al., 2019) is: 42%    Assessment & Plan:   Return in about 6 months (around 09/28/2023) for chronic medical f/u.   Essential hypertension Assessment & Plan: 154/81, 132/78 on repeat Controlled. Continue current medications. No change in management. Discussed DASH diet and dietary sodium restrictions.  Continue dietary efforts and physical activity.    Type 2 diabetes mellitus without complication, without long-term current use of insulin (HCC) Assessment & Plan: Continue current management.  Patient is on statin. Lipid panel checked less than a year ago. Microalbumin checked less than a year ago.  Continue dietary efforts and physical activity. A1c ordered- due end of March   Orders: -     Hemoglobin A1c    Chrisie Jankovich, PA-C

## 2023-03-31 NOTE — Assessment & Plan Note (Addendum)
Continue current management.  Patient is on statin. Lipid panel checked less than a year ago. Microalbumin checked less than a year ago.  Continue dietary efforts and physical activity. A1c ordered- due end of March

## 2023-03-31 NOTE — Assessment & Plan Note (Signed)
 154/81, 132/78 on repeat Controlled. Continue current medications. No change in management. Discussed DASH diet and dietary sodium restrictions.  Continue dietary efforts and physical activity.

## 2023-04-07 LAB — MISC LABCORP TEST (SEND OUT): Labcorp test code: 480985

## 2023-04-20 ENCOUNTER — Inpatient Hospital Stay: Payer: Medicare HMO | Attending: Hematology | Admitting: Hematology

## 2023-04-20 VITALS — BP 162/68 | HR 70 | Temp 97.8°F | Resp 20 | Wt 199.1 lb

## 2023-04-20 DIAGNOSIS — D72820 Lymphocytosis (symptomatic): Secondary | ICD-10-CM | POA: Diagnosis not present

## 2023-04-20 DIAGNOSIS — D696 Thrombocytopenia, unspecified: Secondary | ICD-10-CM | POA: Diagnosis not present

## 2023-04-20 DIAGNOSIS — Z87891 Personal history of nicotine dependence: Secondary | ICD-10-CM | POA: Diagnosis not present

## 2023-04-20 NOTE — Progress Notes (Signed)
 [facility] [address], [place] [address]   Clinic Day:  04/20/2023  Referring physician: Tommie Sams, DO  Patient Care Team: Tommie Sams, DO as PCP - General (Family Medicine)   ASSESSMENT & PLAN:   Assessment:  1.  Mild thrombocytopenia: - Patient seen at the request of Dr. Adriana Simas. - PLT-109 (06/12/2022), 101 (07/17/2022) - She reports symptoms of sinus infection for few weeks prior to 07/24/2022 when she was prescribed Augmentin for 7 days. - Pantoprazole was discontinued and famotidine was started on 07/24/2022.  No other new medications.  Denies taking any quinine/tonic water.  She has been on Celexa for 13 to 14 years and Crestor for approximately 1 year and Synthroid for 2 years. - Ultrasound abdomen (11/26/2022): Spleen normal in size with volume 127 mL.  Hepatic steatosis. - NGS: TET 2 - Flow cytometry (01/27/2023): Flow cytometric analysis of the lymphoid population shows predominance of T cells (95% of lymphocytes) expressing pan T-cell antigens with reversal of CD4: CD8 ratio.  Fraction of T cells (40%) shows slightly dimmer expression for CD5 with little to evidence of CD8 positivity.  T cells changes may indicate a neoplastic process particularly of large granular lymphocytes.  Relatively abundant monocytic population in the specimen (20%) shows typical phenotypic features. - T-cell gene rearrangement study: Clonal T-cell receptor beta population was detected.  2.  Social/family history: - She lives by herself at home and is independent of ADLs and IADLs.  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  Quit smoking in 1997.  She smoked 1 pack/day for 30 years.  No direct chemical exposure. - No family history of thrombocytopenia or malignancies.  Plan:  1.  Large granular lymphocytosis and mild thrombocytopenia: - We reviewed all her previous blood work.  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   Previous workup for connective tissue disorders was negative.  Based on the T-cell gene rearrangement studies, it may or may not be a clonal process. - We discussed option of working up with bone marrow biopsy.  She does not have any B symptoms.  No cytopenias other than mild thrombocytopenia which is stable since April 2024. - After hearing the options, she decided to put off the bone marrow biopsy at this time.  I will see her back in 6 months for follow-up with repeat labs.   Orders Placed This Encounter  Procedures   CBC with Differential    Standing Status:   Future    Expected Date:   10/25/2023    Expiration Date:   04/19/2024   Lactate dehydrogenase    Standing Status:   Future    Expected Date:   10/25/2023    Expiration Date:   04/19/2024      [name],acting as a scribe for Doreatha Massed, MD.,have documented all relevant documentation on the behalf of Doreatha Massed, MD,as directed by  Doreatha Massed, MD while in the presence of Doreatha Massed, MD.  I, Doreatha Massed MD, have reviewed the above documentation for accuracy and completeness, and I agree with the above.     Doreatha Massed, MD   2/25/20252:41 PM  CHIEF COMPLAINT/PURPOSE OF CONSULT:   Diagnosis: low platelet count  Current Therapy: Under workup  HISTORY OF PRESENT ILLNESS:   Morgan Hill is a 78 y.o. female presenting to clinic today for evaluation of low platelet count at the request of Dr. Adriana Simas.  She was found to have abnormal CBC from 06/12/22, with a platelet count of 109k. This was repeated recently on 07/17/22 showing persistently low platelets at 101k. The remainder of her CBC has been WNL.  Today, she states that she is doing well overall. Her appetite level is at 100%. Her energy level is at 80%.  INTERVAL HISTORY:   Morgan Hill is a 78 y.o. female presenting to the clinic today for follow-up of thrombocytopenia. She was last seen by me on 08/04/22 in consultation.  Morgan Hill has most recently been seen by PA, Buford Dresser, Rebekah  on 03/23/23.   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 Flow cytometry on 01/27/23 showed T cells in relative abundance of CD8 positive cells with lymphoid population showing predominance of T cells (95% lymphocytes) expressing pan T-cell antigens.   Today, she states that she is doing well overall. Her appetite level is at 60%. Her energy level is at 60%. She is accompanied by a family member. [name] denies any autoimmune disorders.   PAST MEDICAL HISTORY:   Past Medical History: Past Medical History:  Diagnosis Date   Back pain    CTS (carpal tunnel syndrome)    right   GERD (gastroesophageal reflux disease)    High cholesterol    Hypertension     Surgical History: Past Surgical History:  Procedure Laterality Date   CATARACT EXTRACTION BILATERAL W/ ANTERIOR VITRECTOMY Bilateral    03/27/01 right 05/15/21 left    Social History: Social History   Socioeconomic History   Marital status: Single    Spouse name: Not on file   Number of children: Not on file   Years of education: Not on file   Highest education level: 12th grade  Occupational History   Not on file  Tobacco Use   Smoking status: Former    Current packs/day: 0.00    Types: Cigarettes    Quit date: 02/24/1995    Years since quitting: 28.1   Smokeless tobacco: Former    Quit date: 01/11/1996  Vaping Use   Vaping status: Never Used  Substance and Sexual Activity   Alcohol use: Yes    Alcohol/week: 1.0 standard drink of alcohol    Types: 1 Glasses of wine per week    Comment: occasional   Drug use: No   Sexual activity: Not on file  Other Topics Concern   Not on file  Social History Narrative   Not on file   Social Drivers of Health   Financial Resource Strain: Low Risk  (03/28/2023)   Overall Financial Resource Strain (CARDIA)    Difficulty of Paying Living Expenses: Not very hard   Food Insecurity: No Food Insecurity (03/28/2023)   Hunger Vital Sign    Worried About Running Out of Food in the Last Year: Never true    Ran Out of Food in the Last Year: Never true  Transportation Needs: No Transportation Needs (03/28/2023)   PRAPARE - Administrator, Civil Service (Medical): No    Lack of Transportation (Non-Medical): No  Physical Activity: Insufficiently Active (03/28/2023)   Exercise Vital Sign    Days of Exercise per Week: 2 days    Minutes of Exercise per Session: 30 min  Stress: Stress Concern Present (03/28/2023)   [employer] of Occupational Health - Occupational Stress Questionnaire    Feeling of Stress : To some extent  Social Connections: Unknown (03/28/2023)  Social Advertising account executive [NHANES]    Frequency of Communication with Friends and Family: Twice a week    Frequency of Social Gatherings with Friends and Family: Once a week    Attends Religious Services: Patient declined    Database administrator or Organizations: No    Attends Engineer, structural: Not on file    Marital Status: Divorced  Catering manager Violence: Not on file    Family History: Family History  Problem Relation Age of Onset   Hypertension Mother    Diabetes Mother    Heart disease Mother     Current Medications:  Current Outpatient Medications:    citalopram (CELEXA) 10 MG tablet, TAKE 1 TABLET BY MOUTH EVERY DAY, Disp: 90 tablet, Rfl: 1   diclofenac (VOLTAREN) 75 MG EC tablet, Take 1 tablet (75 mg total) by mouth 2 (two) times daily as needed for moderate pain., Disp: 30 tablet, Rfl: 1   famotidine (PEPCID) 20 MG tablet, TAKE 1 TABLET BY MOUTH TWICE A DAY, Disp: 180 tablet, Rfl: 1   levothyroxine (SYNTHROID) 50 MCG tablet, TAKE 1 TABLET BY MOUTH EVERY DAY, Disp: 90 tablet, Rfl: 1   losartan (COZAAR) 50 MG tablet, Take 1 tablet (50 mg total) by mouth daily., Disp: 90 tablet, Rfl: 3   meclizine (ANTIVERT) 25 MG tablet, Take 1 tablet (25 mg  total) by mouth 3 (three) times daily as needed for dizziness., Disp: 30 tablet, Rfl: 1   Propylene Glycol (SYSTANE BALANCE OP), Apply 1 drop to eye in the morning, at noon, and at bedtime. Dry eye, Disp: , Rfl:    rosuvastatin (CRESTOR) 10 MG tablet, TAKE 1 TABLET BY MOUTH EVERY DAY, Disp: 90 tablet, Rfl: 1   Allergies: No Known Allergies  REVIEW OF SYSTEMS:   Review of Systems  Constitutional:  Negative for chills, fatigue and fever.  HENT:   Negative for lump/mass, mouth sores, nosebleeds, sore throat and trouble swallowing.   Eyes:  Negative for eye problems.  Respiratory:  Negative for cough and shortness of breath.   Cardiovascular:  Negative for chest pain, leg swelling and palpitations.  Gastrointestinal:  Negative for abdominal pain, constipation, diarrhea, nausea and vomiting.  Genitourinary:  Negative for bladder incontinence, difficulty urinating, dysuria, frequency, hematuria and nocturia.   Musculoskeletal:  Negative for arthralgias, back pain, flank pain, myalgias and neck pain.  Skin:  Negative for itching and rash.  Neurological:  Positive for dizziness and headaches. Negative for numbness.  Hematological:  Does not bruise/bleed easily.  Psychiatric/Behavioral:  Positive for depression. Negative for sleep disturbance and suicidal ideas. The patient is nervous/anxious.   All other systems reviewed and are negative.    VITALS:   Blood pressure (!) 162/68, pulse 70, temperature 97.8 F (36.6 C), temperature source Tympanic, resp. rate 20, weight 199 lb 1.2 oz (90.3 kg), SpO2 99%.  Wt Readings from Last 3 Encounters:  04/20/23 199 lb 1.2 oz (90.3 kg)  03/31/23 203 lb (92.1 kg)  03/23/23 204 lb 12.9 oz (92.9 kg)    Body mass index is 34.17 kg/m.   PHYSICAL EXAM:   Physical Exam Vitals and nursing note reviewed. Exam conducted with a chaperone present.  Constitutional:      Appearance: Normal appearance.  Cardiovascular:     Rate and Rhythm: Normal rate and  regular rhythm.     Pulses: Normal pulses.     Heart sounds: Normal heart sounds.  Pulmonary:     Effort: Pulmonary effort is normal.  Breath sounds: Normal breath sounds.  Abdominal:     Palpations: Abdomen is soft. There is no hepatomegaly, splenomegaly or mass.     Tenderness: There is no abdominal tenderness.  Musculoskeletal:     Right lower leg: No edema.     Left lower leg: No edema.  Lymphadenopathy:     Cervical: No cervical adenopathy.     Right cervical: No superficial, deep or posterior cervical adenopathy.    Left cervical: No superficial, deep or posterior cervical adenopathy.     Upper Body:     Right upper body: No supraclavicular or axillary adenopathy.     Left upper body: No supraclavicular or axillary adenopathy.  Neurological:     General: No focal deficit present.     Mental Status: She is alert and oriented to person, place, and time.  Psychiatric:        Mood and Affect: Mood normal.        Behavior: Behavior normal.     LABS:      Latest Ref Rng & Units 03/17/2023    1:16 PM 01/27/2023    1:37 PM 11/18/2022   12:51 PM  CBC  WBC 4.0 - 10.5 K/uL 7.7  8.2  6.6   Hemoglobin 12.0 - 15.0 g/dL 16.1  09.6  04.5   Hematocrit 36.0 - 46.0 % 38.7  40.5  40.4   Platelets 150 - 400 K/uL 126  110  112       Latest Ref Rng & Units 03/17/2023    1:16 PM 01/11/2023    2:36 PM 12/24/2022   10:11 AM  CMP  Glucose 70 - 99 mg/dL 409  811  914   BUN 8 - 23 mg/dL 18  24  13    Creatinine 0.44 - 1.00 mg/dL 7.82  9.56  2.13   Sodium 135 - 145 mmol/L 140  138  140   Potassium 3.5 - 5.1 mmol/L 4.5  4.7  4.5   Chloride 98 - 111 mmol/L 106  101  102   CO2 22 - 32 mmol/L 24  17  24    Calcium 8.9 - 10.3 mg/dL 8.9  9.6  9.8   Total Protein 6.5 - 8.1 g/dL 7.2     Total Bilirubin 0.0 - 1.2 mg/dL 0.5     Alkaline Phos 38 - 126 U/L 50     AST 15 - 41 U/L 18     ALT 0 - 44 U/L 19        No results found for: "CEA1", "CEA" / No results found for: "CEA1", "CEA" No  results found for: "PSA1" No results found for: "CAN199" No results found for: "CAN125"  Lab Results  Component Value Date   TOTALPROTELP 7.6 08/04/2022   TOTALPROTELP 7.6 08/04/2022   ALBUMINELP 3.9 08/04/2022   A1GS 0.3 08/04/2022   A2GS 0.6 08/04/2022   BETS 1.2 08/04/2022   GAMS 1.7 08/04/2022   MSPIKE Not Observed 08/04/2022   SPEI Comment 08/04/2022   Lab Results  Component Value Date   TIBC 429 08/04/2022   FERRITIN 70 08/04/2022   IRONPCTSAT 21 08/04/2022   Lab Results  Component Value Date   LDH 178 03/17/2023   LDH 174 09/17/2022     STUDIES:   No results found.

## 2023-04-20 NOTE — Patient Instructions (Addendum)
 [facility] at [facility] Discharge Instructions   You were seen and examined today by Dr. Ellin Saba.  He reviewed the results of your lab work. 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 We would not treat this condition unless you are having side effects from the condition.  We will just monitor you for now.   We will see you back in 6 months. We will repeat lab work prior to this visit.   Return as scheduled.    Thank you for choosing [facility] at [facility] to provide your oncology and hematology care.  To afford each patient quality time with our provider, please arrive at least 15 minutes before your scheduled appointment time.   If you have a lab appointment with the Cancer Center please come in thru the Main Entrance and check in at the main information desk.  You need to re-schedule your appointment should you arrive 10 or more minutes late.  We strive to give you quality time with our providers, and arriving late affects you and other patients whose appointments are after yours.  Also, if you no show three or more times for appointments you may be dismissed from the clinic at the providers discretion.     Again, thank you for choosing [facility].  Our hope is that these requests will decrease the amount of time that you wait before being seen by our physicians.       _____________________________________________________________  Should you have questions after your visit to [facility], please contact our office at [phone] and follow the prompts.  Our office hours are 8:00 a.m. and 4:30 p.m. Monday - Friday.  Please note that voicemails left after 4:00 p.m. may not be returned until the following business day.  We are closed weekends and major holidays.  You do have access to a nurse 24-7, just call the main number to the clinic  [phone] and do not press any options, hold on the line and a nurse will answer the phone.    For prescription refill requests, have your pharmacy contact our office and allow 72 hours.    Due to Covid, you will need to wear a mask upon entering the hospital. If you do not have a mask, a mask will be given to you at the Main Entrance upon arrival. For doctor visits, patients may have 1 support person age 46 or older with them. For treatment visits, patients can not have anyone with them due to social distancing guidelines and our immunocompromised population.

## 2023-05-16 ENCOUNTER — Other Ambulatory Visit: Payer: Self-pay | Admitting: Family Medicine

## 2023-05-16 DIAGNOSIS — E039 Hypothyroidism, unspecified: Secondary | ICD-10-CM

## 2023-05-21 DIAGNOSIS — E119 Type 2 diabetes mellitus without complications: Secondary | ICD-10-CM | POA: Diagnosis not present

## 2023-05-22 LAB — HEMOGLOBIN A1C
Est. average glucose Bld gHb Est-mCnc: 148 mg/dL
Hgb A1c MFr Bld: 6.8 % — ABNORMAL HIGH (ref 4.8–5.6)

## 2023-05-24 ENCOUNTER — Encounter: Payer: Self-pay | Admitting: Physician Assistant

## 2023-06-09 ENCOUNTER — Other Ambulatory Visit: Payer: Self-pay | Admitting: Family Medicine

## 2023-06-15 DIAGNOSIS — H04123 Dry eye syndrome of bilateral lacrimal glands: Secondary | ICD-10-CM | POA: Diagnosis not present

## 2023-06-23 ENCOUNTER — Other Ambulatory Visit: Payer: Self-pay | Admitting: Family Medicine

## 2023-06-23 DIAGNOSIS — F321 Major depressive disorder, single episode, moderate: Secondary | ICD-10-CM

## 2023-09-21 ENCOUNTER — Other Ambulatory Visit: Payer: Self-pay | Admitting: Family Medicine

## 2023-09-21 DIAGNOSIS — E039 Hypothyroidism, unspecified: Secondary | ICD-10-CM | POA: Diagnosis not present

## 2023-09-21 DIAGNOSIS — D696 Thrombocytopenia, unspecified: Secondary | ICD-10-CM | POA: Diagnosis not present

## 2023-09-21 DIAGNOSIS — E119 Type 2 diabetes mellitus without complications: Secondary | ICD-10-CM | POA: Diagnosis not present

## 2023-09-21 DIAGNOSIS — E785 Hyperlipidemia, unspecified: Secondary | ICD-10-CM | POA: Diagnosis not present

## 2023-09-22 ENCOUNTER — Ambulatory Visit: Payer: Self-pay | Admitting: Family Medicine

## 2023-09-22 LAB — LIPID PANEL
Chol/HDL Ratio: 2.3 ratio (ref 0.0–4.4)
Cholesterol, Total: 161 mg/dL (ref 100–199)
HDL: 70 mg/dL (ref >39)
LDL Chol Calc (NIH): 73 mg/dL (ref 0–99)
Triglycerides: 103 mg/dL (ref 0–149)
VLDL Cholesterol Cal: 18 mg/dL (ref 5–40)

## 2023-09-22 LAB — CBC
Hematocrit: 43.4 % (ref 34.0–46.6)
Hemoglobin: 14.3 g/dL (ref 11.1–15.9)
MCH: 30.6 pg (ref 26.6–33.0)
MCHC: 32.9 g/dL (ref 31.5–35.7)
MCV: 93 fL (ref 79–97)
Platelets: 117 x10E3/uL — ABNORMAL LOW (ref 150–450)
RBC: 4.68 x10E6/uL (ref 3.77–5.28)
RDW: 13.1 % (ref 11.7–15.4)
WBC: 7.3 x10E3/uL (ref 3.4–10.8)

## 2023-09-22 LAB — CMP14+EGFR
ALT: 17 IU/L (ref 0–32)
AST: 18 IU/L (ref 0–40)
Albumin: 4.6 g/dL (ref 3.8–4.8)
Alkaline Phosphatase: 66 IU/L (ref 44–121)
BUN/Creatinine Ratio: 13 (ref 12–28)
BUN: 12 mg/dL (ref 8–27)
Bilirubin Total: 0.9 mg/dL (ref 0.0–1.2)
CO2: 20 mmol/L (ref 20–29)
Calcium: 9.9 mg/dL (ref 8.7–10.3)
Chloride: 102 mmol/L (ref 96–106)
Creatinine, Ser: 0.93 mg/dL (ref 0.57–1.00)
Globulin, Total: 3.5 g/dL (ref 1.5–4.5)
Glucose: 125 mg/dL — ABNORMAL HIGH (ref 70–99)
Potassium: 4.5 mmol/L (ref 3.5–5.2)
Sodium: 138 mmol/L (ref 134–144)
Total Protein: 8.1 g/dL (ref 6.0–8.5)
eGFR: 64 mL/min/1.73 (ref >59)

## 2023-09-22 LAB — MICROALBUMIN / CREATININE URINE RATIO
Creatinine, Urine: 93.5 mg/dL
Microalb/Creat Ratio: 11 mg/g{creat} (ref 0–29)
Microalbumin, Urine: 10.6 ug/mL

## 2023-09-22 LAB — TSH: TSH: 4.68 u[IU]/mL — ABNORMAL HIGH (ref 0.450–4.500)

## 2023-09-22 LAB — HEMOGLOBIN A1C
Est. average glucose Bld gHb Est-mCnc: 146 mg/dL
Hgb A1c MFr Bld: 6.7 % — ABNORMAL HIGH (ref 4.8–5.6)

## 2023-09-28 ENCOUNTER — Encounter: Payer: Self-pay | Admitting: Family Medicine

## 2023-09-28 ENCOUNTER — Ambulatory Visit (INDEPENDENT_AMBULATORY_CARE_PROVIDER_SITE_OTHER): Payer: Medicare HMO | Admitting: Family Medicine

## 2023-09-28 VITALS — BP 136/72 | HR 77 | Temp 97.0°F | Ht 64.0 in | Wt 198.0 lb

## 2023-09-28 DIAGNOSIS — I10 Essential (primary) hypertension: Secondary | ICD-10-CM | POA: Diagnosis not present

## 2023-09-28 DIAGNOSIS — E119 Type 2 diabetes mellitus without complications: Secondary | ICD-10-CM

## 2023-09-28 DIAGNOSIS — E785 Hyperlipidemia, unspecified: Secondary | ICD-10-CM | POA: Diagnosis not present

## 2023-09-28 DIAGNOSIS — E039 Hypothyroidism, unspecified: Secondary | ICD-10-CM | POA: Diagnosis not present

## 2023-09-28 MED ORDER — LOSARTAN POTASSIUM 50 MG PO TABS: 50.0000 mg | ORAL_TABLET | Freq: Every day | ORAL | 3 refills | Status: AC

## 2023-09-28 NOTE — Progress Notes (Signed)
 Subjective:  Patient ID: Morgan Hill, female    DOB: [birth year]  Age: 78 y.o. MRN: [id number]  CC:   Chief Complaint  Patient presents with   Follow-up    6 month f/u hypertension, DM 2    HPI:  78 year old female with the below mentioned medical problems presents for follow-up.  Blood pressure is well-controlled.  Recent labs reviewed with the patient.  Thrombocytopenia is stable.  LDL fairly well-controlled at 73.  A1c at goal.  TSH mildly elevated.  Recommended repeat TSH in 6 weeks.  Patient currently on levothyroxine  50 mcg daily.  Advised against increase until repeat studies.  Patient denies chest pain.  She does report some shortness of breath when she really exerts herself.  Good appetite.  She has no other complaints or concerns at this time.  Patient Active Problem List   Diagnosis Date Noted   Osteoarthritis 12/28/2022   Essential hypertension 12/23/2022   Metabolic dysfunction-associated steatotic liver disease (MASLD) 12/23/2022   Thrombocytopenia (HCC) 06/25/2022   Preventative health care 11/05/2021   Hyperlipidemia 04/24/2021   Hypothyroidism 08/19/2020   Depression 07/14/2014   Type 2 diabetes mellitus without complications (HCC) 11/02/2013   GERD (gastroesophageal reflux disease) 05/03/2013   Obesity 05/02/2013    Social Hx   Social History   Socioeconomic History   Marital status: Single    Spouse name: Not on file   Number of children: Not on file   Years of education: Not on file   Highest education level: 12th grade  Occupational History   Not on file  Tobacco Use   Smoking status: Former    Current packs/day: 0.00    Types: Cigarettes    Quit date: 02/24/1995    Years since quitting: 28.6   Smokeless tobacco: Former    Quit date: 01/11/1996  Vaping Use   Vaping status: Never Used  Substance and Sexual Activity   Alcohol use: Yes    Alcohol/week: 1.0 standard drink of alcohol    Types: 1 Glasses of wine per week    Comment:  occasional   Drug use: No   Sexual activity: Not on file  Other Topics Concern   Not on file  Social History Narrative   Not on file   Social Drivers of Health   Financial Resource Strain: Low Risk  (09/27/2023)   Overall Financial Resource Strain (CARDIA)    Difficulty of Paying Living Expenses: Not very hard  Food Insecurity: No Food Insecurity (09/27/2023)   Hunger Vital Sign    Worried About Running Out of Food in the Last Year: Never true    Ran Out of Food in the Last Year: Never true  Transportation Needs: No Transportation Needs (09/27/2023)   PRAPARE - Administrator, Civil Service (Medical): No    Lack of Transportation (Non-Medical): No  Physical Activity: Insufficiently Active (09/27/2023)   Exercise Vital Sign    Days of Exercise per Week: 2 days    Minutes of Exercise per Session: 30 min  Stress: No Stress Concern Present (09/27/2023)   [employer] of Occupational Health - Occupational Stress Questionnaire    Feeling of Stress: Only a little  Social Connections: Socially Isolated (09/27/2023)   Social Connection and Isolation Panel    Frequency of Communication with Friends and Family: Once a week    Frequency of Social Gatherings with Friends and Family: Once a week    Attends Religious Services: Patient declined    Active Member  of Clubs or Organizations: No    Attends Engineer, structural: Not on file    Marital Status: Divorced    Review of Systems Per HPI  Objective:  BP 136/72   Pulse 77   Temp (!) 97 F (36.1 C)   Ht 5' 4 (1.626 m)   Wt 198 lb (89.8 kg)   SpO2 96%   BMI 33.99 kg/m      09/28/2023   10:53 AM 04/20/2023   10:50 AM 03/31/2023   11:08 AM  BP/Weight  Systolic BP 136 162 132  Diastolic BP 72 68 78  Wt. (Lbs) 198 199.08   BMI 33.99 kg/m2 34.17 kg/m2     Physical Exam Vitals and nursing note reviewed.  Constitutional:      General: She is not in acute distress.    Appearance: Normal appearance.  HENT:      Head: Normocephalic and atraumatic.  Eyes:     General:        Right eye: No discharge.        Left eye: No discharge.     Conjunctiva/sclera: Conjunctivae normal.  Cardiovascular:     Rate and Rhythm: Normal rate and regular rhythm.  Pulmonary:     Effort: Pulmonary effort is normal.     Breath sounds: Normal breath sounds. No wheezing, rhonchi or rales.  Neurological:     Mental Status: She is alert.  Psychiatric:        Mood and Affect: Mood normal.        Behavior: Behavior normal.     Lab Results  Component Value Date   WBC 7.3 09/21/2023   HGB 14.3 09/21/2023   HCT 43.4 09/21/2023   PLT 117 (L) 09/21/2023   GLUCOSE 125 (H) 09/21/2023   CHOL 161 09/21/2023   TRIG 103 09/21/2023   HDL 70 09/21/2023   LDLCALC 73 09/21/2023   ALT 17 09/21/2023   AST 18 09/21/2023   NA 138 09/21/2023   K 4.5 09/21/2023   CL 102 09/21/2023   CREATININE 0.93 09/21/2023   BUN 12 09/21/2023   CO2 20 09/21/2023   TSH 4.680 (H) 09/21/2023   INR 1.1 12/24/2022   HGBA1C 6.7 (H) 09/21/2023     Assessment & Plan:  Essential hypertension Assessment & Plan: Stable. Continue Losartan .  Orders: -     Losartan  Potassium; Take 1 tablet (50 mg total) by mouth daily.  Dispense: 90 tablet; Refill: 3  Hypothyroidism, unspecified type Assessment & Plan: TSH mildly elevated.  Repeating TSH in 6 weeks.  Orders: -     TSH  Type 2 diabetes mellitus without complication, without long-term current use of insulin (HCC) Assessment & Plan: Well-controlled.  Will continue to monitor closely.  Diabetic foot exam performed today.   Hyperlipidemia, unspecified hyperlipidemia type Assessment & Plan: LDL 73.  Continue Crestor .     Follow-up: 6 months  Jomaira Darr Bluford DO [facility]

## 2023-09-28 NOTE — Assessment & Plan Note (Signed)
 Well-controlled.  Will continue to monitor closely.  Diabetic foot exam performed today.

## 2023-09-28 NOTE — Patient Instructions (Signed)
 You're doing well.  Lab in 6 weeks.  Follow up in 6 months.  Take care  Dr. Bluford

## 2023-09-28 NOTE — Assessment & Plan Note (Signed)
 TSH mildly elevated.  Repeating TSH in 6 weeks.

## 2023-09-28 NOTE — Assessment & Plan Note (Signed)
 LDL 73.  Continue Crestor .

## 2023-09-28 NOTE — Assessment & Plan Note (Signed)
Stable. Continue Losartan. 

## 2023-10-19 ENCOUNTER — Inpatient Hospital Stay: Payer: Medicare HMO | Admitting: Oncology

## 2023-10-19 ENCOUNTER — Inpatient Hospital Stay: Payer: Medicare HMO | Attending: Oncology

## 2023-10-19 VITALS — BP 185/75 | HR 66 | Temp 97.6°F | Resp 16 | Wt 199.1 lb

## 2023-10-19 DIAGNOSIS — D696 Thrombocytopenia, unspecified: Secondary | ICD-10-CM | POA: Diagnosis not present

## 2023-10-19 DIAGNOSIS — D72820 Lymphocytosis (symptomatic): Secondary | ICD-10-CM | POA: Insufficient documentation

## 2023-10-19 LAB — CBC WITH DIFFERENTIAL/PLATELET
Abs Immature Granulocytes: 0.03 K/uL (ref 0.00–0.07)
Basophils Absolute: 0.1 K/uL (ref 0.0–0.1)
Basophils Relative: 1 %
Eosinophils Absolute: 0 K/uL (ref 0.0–0.5)
Eosinophils Relative: 0 %
HCT: 40.6 % (ref 36.0–46.0)
Hemoglobin: 13.2 g/dL (ref 12.0–15.0)
Immature Granulocytes: 0 %
Lymphocytes Relative: 60 %
Lymphs Abs: 4.2 K/uL — ABNORMAL HIGH (ref 0.7–4.0)
MCH: 29.7 pg (ref 26.0–34.0)
MCHC: 32.5 g/dL (ref 30.0–36.0)
MCV: 91.4 fL (ref 80.0–100.0)
Monocytes Absolute: 1.8 K/uL — ABNORMAL HIGH (ref 0.1–1.0)
Monocytes Relative: 25 %
Neutro Abs: 1 K/uL — ABNORMAL LOW (ref 1.7–7.7)
Neutrophils Relative %: 14 %
Platelets: 116 K/uL — ABNORMAL LOW (ref 150–400)
RBC: 4.44 MIL/uL (ref 3.87–5.11)
RDW: 13.2 % (ref 11.5–15.5)
WBC: 7.1 K/uL (ref 4.0–10.5)
nRBC: 0 % (ref 0.0–0.2)

## 2023-10-19 LAB — LACTATE DEHYDROGENASE: LDH: 199 U/L — ABNORMAL HIGH (ref 98–192)

## 2023-10-19 NOTE — Assessment & Plan Note (Addendum)
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  Flow cytometry is suggestive but not diagnostic for LGL leukemia.  -at her last visit a discussion was had on watching labs and if any significant changes would recommend a bone marrow biopsy.   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 -Based on the T-cell gene rearrangement studies, it may or may not be a clonal process. -Most recent labs discussed with patient in detail and are relatively stable from 6 months ago.  Platelet counts is at 117.  No other cytopenias based on labs. -Of note, LDH mildly elevated at 199.  She does report some joint pain intermittently mainly in left hand and knee. -Continue follow-up every 6 months with labs a few days before.

## 2023-10-19 NOTE — Assessment & Plan Note (Addendum)
 Mild thrombocytopenia.  Most recent platelet count was 117.  She does report easy bruising and bleeding although thinks it is due to her age as well.  No bleeding, melena or hematochezia.

## 2023-10-19 NOTE — Progress Notes (Signed)
 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  Flow cytometry is suggestive but not diagnostic for LGL leukemia.  -at her last visit a discussion was had on watching labs and if any significant changes would recommend a bone marrow biopsy.   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 -Based on the T-cell gene rearrangement studies, it may or may not be a clonal process. -Most recent labs discussed with patient in detail and are relatively stable from 6 months ago.  Platelet counts is at 117.  No other cytopenias based on labs. -Of note, LDH mildly elevated at 199.  She does report some joint pain intermittently mainly in left hand and knee. -Continue follow-up every 6 months with labs a few days before. Thrombocytopenia (HCC) Mild thrombocytopenia.  Most recent platelet count was 117.  She does report easy bruising and bleeding although thinks it is due to her age as well.  No bleeding, melena or hematochezia.  No orders of the defined types were placed in this encounter.   INTERVAL HISTORY: Patient returns for follow-up for large granular lymphocytosis and thrombocytopenia.  Denies interval hospitalizations, surgeries or changes to baseline health.  No bleeding, bright red blood per rectum, melena or hematochezia.  Reports she bruises easily but feels is unrelated to her platelet count and more based on her age.  States she fell backwards onto a picnic table and fairly significant bruising and swelling to her left calf.  Symptoms have slowly gone away.  We reviewed LDH and CBC.  SUMMARY OF HEMATOLOGIC HISTORY: Oncology History   No history exists.    1.  Mild thrombocytopenia: - Patient seen at the request of Dr. Bluford. - PLT-109  (06/12/2022), 101 (07/17/2022) - She reports symptoms of sinus infection for few weeks prior to 07/24/2022 when she was prescribed Augmentin  for 7 days. - Pantoprazole  was discontinued and famotidine  was started on 07/24/2022.  No other new medications.  Denies taking any quinine/tonic water.  She has been on Celexa  for 13 to 14 years and Crestor  for approximately 1 year and Synthroid  for 2 years. - Ultrasound abdomen (11/26/2022): Spleen normal in size with volume 127 mL.  Hepatic steatosis. - NGS: TET 2 - Flow cytometry (01/27/2023): Flow cytometric analysis of the lymphoid population shows predominance of T cells (95% of lymphocytes) expressing pan T-cell antigens with reversal of CD4: CD8 ratio.  Fraction of T cells (40%) shows slightly dimmer expression for CD5 with little to evidence of CD8 positivity.  T cells changes may indicate a neoplastic process particularly of large granular lymphocytes.  Relatively abundant monocytic population in the specimen (20%) shows typical phenotypic features. - T-cell gene rearrangement study: Clonal T-cell receptor beta population was detected. CBC    Component Value Date/Time   WBC 7.3 09/21/2023 0947   WBC 7.7 03/17/2023 1316   RBC 4.68 09/21/2023 0947   RBC 4.18 03/17/2023 1316   HGB 14.3 09/21/2023 0947   HCT 43.4 09/21/2023 0947   PLT 117 (L) 09/21/2023 0947   MCV 93 09/21/2023 0947   MCH 30.6 09/21/2023 0947   MCH 29.7 03/17/2023 1316   MCHC 32.9 09/21/2023 0947   MCHC 32.0 03/17/2023 1316   RDW 13.1 09/21/2023 0947   LYMPHSABS 4.0 03/17/2023 1316   LYMPHSABS 3.2 (H)  07/17/2022 1048   MONOABS 1.9 (H) 03/17/2023 1316   EOSABS 0.0 03/17/2023 1316   EOSABS 0.1 07/17/2022 1048   BASOSABS 0.0 03/17/2023 1316   BASOSABS 0.0 07/17/2022 1048       Latest Ref Rng & Units 09/21/2023    9:47 AM 03/17/2023    1:16 PM 01/11/2023    2:36 PM  CMP  Glucose 70 - 99 mg/dL 874  866  894   BUN 8 - 27 mg/dL 12  18  24    Creatinine 0.57 - 1.00 mg/dL 9.06  9.23   8.99   Sodium 134 - 144 mmol/L 138  140  138   Potassium 3.5 - 5.2 mmol/L 4.5  4.5  4.7   Chloride 96 - 106 mmol/L 102  106  101   CO2 20 - 29 mmol/L 20  24  17    Calcium  8.7 - 10.3 mg/dL 9.9  8.9  9.6   Total Protein 6.0 - 8.5 g/dL 8.1  7.2    Total Bilirubin 0.0 - 1.2 mg/dL 0.9  0.5    Alkaline Phos 44 - 121 IU/L 66  50    AST 0 - 40 IU/L 18  18    ALT 0 - 32 IU/L 17  19       Lab Results  Component Value Date   FERRITIN 70 08/04/2022   VITAMINB12 523 08/04/2022    There were no vitals filed for this visit.  Review of System:  Review of Systems  Respiratory:  Positive for shortness of breath.   Musculoskeletal:  Positive for joint pain.  Neurological:  Positive for dizziness.  Endo/Heme/Allergies:  Bruises/bleeds easily.    Physical Exam: Physical Exam Constitutional:      Appearance: Normal appearance.  HENT:     Head: Normocephalic and atraumatic.  Eyes:     Pupils: Pupils are equal, round, and reactive to light.  Cardiovascular:     Rate and Rhythm: Normal rate and regular rhythm.     Heart sounds: Normal heart sounds. No murmur heard. Pulmonary:     Effort: Pulmonary effort is normal.     Breath sounds: Normal breath sounds. No wheezing.  Abdominal:     General: Bowel sounds are normal. There is no distension.     Palpations: Abdomen is soft.     Tenderness: There is no abdominal tenderness.  Musculoskeletal:        General: Normal range of motion.     Cervical back: Normal range of motion.  Skin:    General: Skin is warm and dry.     Findings: No rash.  Neurological:     Mental Status: She is alert and oriented to person, place, and time.     Gait: Gait is intact.  Psychiatric:        Mood and Affect: Mood and affect normal.        Cognition and Memory: Memory normal.        Judgment: Judgment normal.      I spent 20 minutes dedicated to the care of this patient (face-to-face and non-face-to-face) on the date of the encounter to include what is  described in the assessment and plan.,  Delon Hope, NP 10/19/2023 9:22 AM

## 2023-11-02 ENCOUNTER — Other Ambulatory Visit: Payer: Self-pay | Admitting: Family Medicine

## 2023-11-02 DIAGNOSIS — E039 Hypothyroidism, unspecified: Secondary | ICD-10-CM

## 2023-11-02 DIAGNOSIS — F321 Major depressive disorder, single episode, moderate: Secondary | ICD-10-CM

## 2023-11-03 ENCOUNTER — Other Ambulatory Visit: Payer: Self-pay | Admitting: Family Medicine

## 2023-11-17 DIAGNOSIS — E039 Hypothyroidism, unspecified: Secondary | ICD-10-CM | POA: Diagnosis not present

## 2023-11-18 LAB — TSH: TSH: 3.44 u[IU]/mL (ref 0.450–4.500)

## 2023-11-26 ENCOUNTER — Other Ambulatory Visit: Payer: Self-pay | Admitting: Family Medicine

## 2023-11-28 ENCOUNTER — Other Ambulatory Visit: Payer: Self-pay | Admitting: Family Medicine

## 2023-11-28 DIAGNOSIS — R42 Dizziness and giddiness: Secondary | ICD-10-CM

## 2024-03-21 NOTE — Progress Notes (Signed)
 Morgan Hill                                          MRN: [id number]   03/21/2024   The [facility] Quality Team Specialist reviewed this patient medical record for the purposes of chart review for care gap closure. The following were reviewed: chart review for care gap closure-controlling blood pressure.    [facility] Quality Team

## 2024-03-22 ENCOUNTER — Other Ambulatory Visit: Payer: Self-pay | Admitting: Family Medicine

## 2024-03-22 ENCOUNTER — Telehealth: Payer: Self-pay

## 2024-03-22 DIAGNOSIS — D696 Thrombocytopenia, unspecified: Secondary | ICD-10-CM

## 2024-03-22 DIAGNOSIS — E119 Type 2 diabetes mellitus without complications: Secondary | ICD-10-CM

## 2024-03-22 DIAGNOSIS — E039 Hypothyroidism, unspecified: Secondary | ICD-10-CM

## 2024-03-22 DIAGNOSIS — E785 Hyperlipidemia, unspecified: Secondary | ICD-10-CM

## 2024-03-22 NOTE — Telephone Encounter (Signed)
 Copied from CRM #[id number]. Topic: Clinical - Request for Lab/Test Order >> Mar 22, 2024 11:38 AM Everette C wrote: Reason for CRM: The patient has called to request lab orders for their 6 month follow up on their A1C, liver, kidney, metabolic panel and any additional necessary labs. Please contact the patient further when possible to discuss if needed

## 2024-03-23 NOTE — Telephone Encounter (Signed)
 Patient is aware about labs being put in

## 2024-03-25 ENCOUNTER — Ambulatory Visit: Payer: Self-pay | Admitting: Family Medicine

## 2024-03-25 LAB — CBC
Hematocrit: 38.3 % (ref 34.0–46.6)
Hemoglobin: 12.6 g/dL (ref 11.1–15.9)
MCH: 29.8 pg (ref 26.6–33.0)
MCHC: 32.9 g/dL (ref 31.5–35.7)
MCV: 91 fL (ref 79–97)
Platelets: 110 10*3/uL — ABNORMAL LOW (ref 150–450)
RBC: 4.23 x10E6/uL (ref 3.77–5.28)
RDW: 12.1 % (ref 11.7–15.4)
WBC: 6 10*3/uL (ref 3.4–10.8)

## 2024-03-25 LAB — CMP14+EGFR
ALT: 18 [IU]/L (ref 0–32)
AST: 20 [IU]/L (ref 0–40)
Albumin: 4.5 g/dL (ref 3.8–4.8)
Alkaline Phosphatase: 61 [IU]/L (ref 49–135)
BUN/Creatinine Ratio: 16 (ref 12–28)
BUN: 14 mg/dL (ref 8–27)
Bilirubin Total: 0.9 mg/dL (ref 0.0–1.2)
CO2: 19 mmol/L — ABNORMAL LOW (ref 20–29)
Calcium: 9 mg/dL (ref 8.7–10.3)
Chloride: 103 mmol/L (ref 96–106)
Creatinine, Ser: 0.9 mg/dL (ref 0.57–1.00)
Globulin, Total: 3.1 g/dL (ref 1.5–4.5)
Glucose: 148 mg/dL — ABNORMAL HIGH (ref 70–99)
Potassium: 4.3 mmol/L (ref 3.5–5.2)
Sodium: 137 mmol/L (ref 134–144)
Total Protein: 7.6 g/dL (ref 6.0–8.5)
eGFR: 66 mL/min/{1.73_m2}

## 2024-03-25 LAB — TSH: TSH: 3.38 u[IU]/mL (ref 0.450–4.500)

## 2024-03-25 LAB — LIPID PANEL
Chol/HDL Ratio: 2.3 ratio (ref 0.0–4.4)
Cholesterol, Total: 154 mg/dL (ref 100–199)
HDL: 66 mg/dL
LDL Chol Calc (NIH): 70 mg/dL (ref 0–99)
Triglycerides: 100 mg/dL (ref 0–149)
VLDL Cholesterol Cal: 18 mg/dL (ref 5–40)

## 2024-03-25 LAB — MICROALBUMIN / CREATININE URINE RATIO
Creatinine, Urine: 164.3 mg/dL
Microalb/Creat Ratio: 11 mg/g{creat} (ref 0–29)
Microalbumin, Urine: 18.4 ug/mL

## 2024-03-25 LAB — HEMOGLOBIN A1C
Est. average glucose Bld gHb Est-mCnc: 160 mg/dL
Hgb A1c MFr Bld: 7.2 % — ABNORMAL HIGH (ref 4.8–5.6)

## 2024-03-30 ENCOUNTER — Ambulatory Visit: Admitting: Family Medicine

## 2024-03-30 VITALS — BP 133/75 | HR 86 | Temp 98.2°F | Ht 64.0 in | Wt 198.8 lb

## 2024-03-30 DIAGNOSIS — E039 Hypothyroidism, unspecified: Secondary | ICD-10-CM

## 2024-03-30 DIAGNOSIS — F321 Major depressive disorder, single episode, moderate: Secondary | ICD-10-CM | POA: Insufficient documentation

## 2024-03-30 DIAGNOSIS — E785 Hyperlipidemia, unspecified: Secondary | ICD-10-CM

## 2024-03-30 DIAGNOSIS — E119 Type 2 diabetes mellitus without complications: Secondary | ICD-10-CM

## 2024-03-30 DIAGNOSIS — I10 Essential (primary) hypertension: Secondary | ICD-10-CM

## 2024-03-30 MED ORDER — ROSUVASTATIN CALCIUM 10 MG PO TABS: 10.0000 mg | ORAL_TABLET | Freq: Every day | ORAL | 3 refills | Status: AC

## 2024-03-30 MED ORDER — CITALOPRAM HYDROBROMIDE 10 MG PO TABS: 10.0000 mg | ORAL_TABLET | Freq: Every day | ORAL | 3 refills | Status: AC

## 2024-03-30 MED ORDER — LEVOTHYROXINE SODIUM 50 MCG PO TABS: 50.0000 ug | ORAL_TABLET | Freq: Every day | ORAL | 3 refills | Status: AC

## 2024-03-30 MED ORDER — MECLIZINE HCL 12.5 MG PO TABS: 12.5000 mg | ORAL_TABLET | Freq: Three times a day (TID) | ORAL | 3 refills | Status: AC | PRN

## 2024-03-30 NOTE — Patient Instructions (Signed)
 Healthy diet and regular exercise.  Follow up in 3 months.

## 2024-03-30 NOTE — Assessment & Plan Note (Signed)
"  Stable on Celexa          "

## 2024-03-30 NOTE — Assessment & Plan Note (Signed)
"   Stable on Crestor .  "

## 2024-03-30 NOTE — Progress Notes (Signed)
 "  Subjective:  Patient ID: Morgan Hill, female    DOB: Mar 07, 1945  Age: 79 y.o. MRN: [id number]  CC:   Chief Complaint  Patient presents with   Follow-up    Pt has questions regarding blood work and medication - diclofenac  sodium     HPI:  79 year old female presents for follow-up.  Patient's recent labs revealed A1c of 7.2.  Previously 6.7.  I have recommended metformin.  She would like to discuss this today.  Patient concerned about CO2 level of 19.  I advised her that this is unremarkable/nonconcerning.  Lipids at goal.  Follows closely with hematology regarding large granular lymphocytosis and thrombocytopenia.  No evidence of cirrhosis.  She does have hepatic steatosis.  Patient reports intermittent dizziness.  Would like a refill on meclizine .  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   Osteoarthritis 12/28/2022   Essential hypertension 12/23/2022   Metabolic dysfunction-associated steatotic liver disease (MASLD) 12/23/2022   Thrombocytopenia 06/25/2022   Preventative health care 11/05/2021   Hyperlipidemia 04/24/2021   Hypothyroidism 08/19/2020   Depression 07/14/2014   Type 2 diabetes mellitus without complications (HCC) 11/02/2013   GERD (gastroesophageal reflux disease) 05/03/2013   Obesity 05/02/2013    Social Hx   Social History   Socioeconomic History   Marital status: Single    Spouse name: Not on file   Number of children: Not on file   Years of education: Not on file   Highest education level: 12th grade  Occupational History   Not on file  Tobacco Use   Smoking status: Former    Current packs/day: 0.00    Types: Cigarettes    Quit date: 02/24/1995    Years since quitting: 29.1   Smokeless tobacco: Former    Quit date: 01/11/1996  Vaping Use   Vaping status: Never Used  Substance and Sexual  Activity   Alcohol use: Yes    Alcohol/week: 1.0 standard drink of alcohol    Types: 1 Glasses of wine per week    Comment: occasional   Drug use: No   Sexual activity: Not on file  Other Topics Concern   Not on file  Social History Narrative   Not on file   Social Drivers of Health   Tobacco Use: Medium Risk (09/28/2023)   Patient History    Smoking Tobacco Use: Former    Smokeless Tobacco Use: Former    Passive Exposure: Not on Actuary Strain: Low Risk (03/28/2024)   Overall Financial Resource Strain (CARDIA)    Difficulty of Paying Living Expenses: Not very hard  Food Insecurity: No Food Insecurity (03/28/2024)   Epic    Worried About Radiation Protection Practitioner of Food in the Last Year: Never true    Ran Out of Food in the Last Year: Never true  Transportation Needs: No Transportation Needs (03/28/2024)   Epic    Lack of Transportation (Medical): No    Lack of Transportation (Non-Medical): No  Physical Activity: Unknown (03/28/2024)   Exercise Vital Sign    Days of Exercise per Week: Patient declined    Minutes of Exercise per Session: Not on file  Stress: Stress Concern Present (03/28/2024)   [employer] of Occupational Health - Occupational Stress Questionnaire    Feeling of Stress: To some extent  Social Connections: Socially Isolated (03/28/2024)   Social Connection and Isolation Panel  Frequency of Communication with Friends and Family: Once a week    Frequency of Social Gatherings with Friends and Family: Once a week    Attends Religious Services: Patient declined    Database Administrator or Organizations: No    Attends Engineer, Structural: Not on file    Marital Status: Divorced  Depression (PHQ2-9): Low Risk (03/30/2024)   Depression (PHQ2-9)    PHQ-2 Score: 3  Alcohol Screen: Low Risk (03/28/2024)   Alcohol Screen    Last Alcohol Screening Score (AUDIT): 2  Housing: Unknown (03/28/2024)   Epic    Unable to Pay for Housing in the Last Year: No     Number of Times Moved in the Last Year: Not on file    Homeless in the Last Year: No  Utilities: Not At Risk (08/04/2022)   [facility] Utilities    Threatened with loss of utilities: No  Health Literacy: Not on file    Review of Systems Per HPI  Objective:  BP 133/75   Pulse 86   Temp 98.2 F (36.8 C)   Ht 5' 4 (1.626 m)   Wt 198 lb 12.8 oz (90.2 kg)   SpO2 97%   BMI 34.12 kg/m      03/30/2024    9:27 AM 10/19/2023   10:32 AM 09/28/2023   10:53 AM  BP/Weight  Systolic BP 133 185 136  Diastolic BP 75 75 72  Wt. (Lbs) 198.8 199.08 198  BMI 34.12 kg/m2 34.17 kg/m2 33.99 kg/m2    Physical Exam Vitals and nursing note reviewed.  Constitutional:      General: She is not in acute distress.    Appearance: Normal appearance.  HENT:     Head: Normocephalic and atraumatic.  Cardiovascular:     Rate and Rhythm: Normal rate and regular rhythm.  Pulmonary:     Effort: Pulmonary effort is normal. No respiratory distress.  Neurological:     Mental Status: She is alert.  Psychiatric:        Mood and Affect: Mood normal.        Behavior: Behavior normal.     Lab Results  Component Value Date   WBC 6.0 03/24/2024   HGB 12.6 03/24/2024   HCT 38.3 03/24/2024   PLT 110 (L) 03/24/2024   GLUCOSE 148 (H) 03/24/2024   CHOL 154 03/24/2024   TRIG 100 03/24/2024   HDL 66 03/24/2024   LDLCALC 70 03/24/2024   ALT 18 03/24/2024   AST 20 03/24/2024   NA 137 03/24/2024   K 4.3 03/24/2024   CL 103 03/24/2024   CREATININE 0.90 03/24/2024   BUN 14 03/24/2024   CO2 19 (L) 03/24/2024   TSH 3.380 03/24/2024   INR 1.1 12/24/2022   HGBA1C 7.2 (H) 03/24/2024     Assessment & Plan:  Type 2 diabetes mellitus without complication, without long-term current use of insulin (HCC) Assessment & Plan: A1c not at goal.  We discussed starting metformin versus proceeding with lifestyle change.  She elected to proceed with lifestyle change.  Rechecking in 3 months.   Current moderate episode of major  depressive disorder without prior episode (HCC) Assessment & Plan: Stable on Celexa .  Orders: -     Citalopram  Hydrobromide; Take 1 tablet (10 mg total) by mouth daily.  Dispense: 90 tablet; Refill: 3  Essential hypertension Assessment & Plan: BP stable.  Continue losartan .   Hyperlipidemia, unspecified hyperlipidemia type Assessment & Plan: Stable on Crestor .  Orders: -  Rosuvastatin  Calcium ; Take 1 tablet (10 mg total) by mouth daily.  Dispense: 90 tablet; Refill: 3  Hypothyroidism, unspecified type -     Levothyroxine  Sodium; Take 1 tablet (50 mcg total) by mouth daily.  Dispense: 90 tablet; Refill: 3  Other orders -     Meclizine  HCl; Take 1 tablet (12.5 mg total) by mouth 3 (three) times daily as needed for dizziness.  Dispense: 30 tablet; Refill: 3    Follow-up: 3 months  Eleonore Shippee Bluford DO [facility] "

## 2024-03-30 NOTE — Assessment & Plan Note (Signed)
 A1c not at goal.  We discussed starting metformin versus proceeding with lifestyle change.  She elected to proceed with lifestyle change.  Rechecking in 3 months.

## 2024-03-30 NOTE — Assessment & Plan Note (Signed)
BP stable.  Continue losartan. ?

## 2024-04-20 ENCOUNTER — Other Ambulatory Visit

## 2024-04-20 ENCOUNTER — Ambulatory Visit: Admitting: Oncology

## 2024-04-27 ENCOUNTER — Inpatient Hospital Stay

## 2024-04-27 ENCOUNTER — Inpatient Hospital Stay: Admitting: Oncology

## 2024-06-27 ENCOUNTER — Ambulatory Visit: Admitting: Family Medicine
